# Patient Record
Sex: Female | Born: 1945 | Race: White | Hispanic: No | Marital: Married | Smoking: Never smoker
Health system: Southern US, Community
[De-identification: ages and names within clinical notes are randomized; demographics above are authoritative.]

## PROBLEM LIST (undated history)

## (undated) DIAGNOSIS — T7840XA Allergy, unspecified, initial encounter: Secondary | ICD-10-CM

## (undated) DIAGNOSIS — I959 Hypotension, unspecified: Secondary | ICD-10-CM

## (undated) DIAGNOSIS — R112 Nausea with vomiting, unspecified: Secondary | ICD-10-CM

## (undated) DIAGNOSIS — Z9889 Other specified postprocedural states: Secondary | ICD-10-CM

## (undated) DIAGNOSIS — F32A Depression, unspecified: Secondary | ICD-10-CM

## (undated) DIAGNOSIS — F329 Major depressive disorder, single episode, unspecified: Secondary | ICD-10-CM

## (undated) HISTORY — DX: Depression, unspecified: F32.A

## (undated) HISTORY — PX: SHOULDER SURGERY: SHX246

## (undated) HISTORY — DX: Allergy, unspecified, initial encounter: T78.40XA

## (undated) HISTORY — DX: Major depressive disorder, single episode, unspecified: F32.9

## (undated) HISTORY — DX: Hypotension, unspecified: I95.9

## (undated) HISTORY — DX: Nausea with vomiting, unspecified: R11.2

## (undated) HISTORY — PX: PARTIAL HYSTERECTOMY: SHX80

## (undated) HISTORY — DX: Hereditary hemochromatosis: E83.110

## (undated) HISTORY — PX: TONSILLECTOMY AND ADENOIDECTOMY: SUR1326

## (undated) HISTORY — DX: Nausea with vomiting, unspecified: Z98.890

---

## 1972-07-02 HISTORY — PX: TUBAL LIGATION: SHX77

## 2011-07-03 HISTORY — PX: COLONOSCOPY: SHX174

## 2014-07-02 HISTORY — PX: ATRIAL FIBRILLATION ABLATION: EP1191

## 2014-07-04 DIAGNOSIS — J019 Acute sinusitis, unspecified: Secondary | ICD-10-CM | POA: Diagnosis not present

## 2014-09-05 DIAGNOSIS — J019 Acute sinusitis, unspecified: Secondary | ICD-10-CM | POA: Diagnosis not present

## 2014-10-29 DIAGNOSIS — J0100 Acute maxillary sinusitis, unspecified: Secondary | ICD-10-CM | POA: Diagnosis not present

## 2014-10-29 DIAGNOSIS — J309 Allergic rhinitis, unspecified: Secondary | ICD-10-CM | POA: Diagnosis not present

## 2014-12-22 DIAGNOSIS — I495 Sick sinus syndrome: Secondary | ICD-10-CM | POA: Insufficient documentation

## 2014-12-23 DIAGNOSIS — I951 Orthostatic hypotension: Secondary | ICD-10-CM | POA: Insufficient documentation

## 2014-12-23 DIAGNOSIS — I480 Paroxysmal atrial fibrillation: Secondary | ICD-10-CM | POA: Diagnosis not present

## 2014-12-23 DIAGNOSIS — I495 Sick sinus syndrome: Secondary | ICD-10-CM | POA: Diagnosis not present

## 2015-01-05 DIAGNOSIS — R079 Chest pain, unspecified: Secondary | ICD-10-CM | POA: Diagnosis not present

## 2015-01-05 DIAGNOSIS — Z1389 Encounter for screening for other disorder: Secondary | ICD-10-CM | POA: Diagnosis not present

## 2015-01-05 DIAGNOSIS — N644 Mastodynia: Secondary | ICD-10-CM | POA: Diagnosis not present

## 2015-01-10 DIAGNOSIS — N644 Mastodynia: Secondary | ICD-10-CM | POA: Diagnosis not present

## 2015-01-10 DIAGNOSIS — R922 Inconclusive mammogram: Secondary | ICD-10-CM | POA: Diagnosis not present

## 2015-01-11 DIAGNOSIS — B86 Scabies: Secondary | ICD-10-CM | POA: Diagnosis not present

## 2015-01-11 DIAGNOSIS — B9689 Other specified bacterial agents as the cause of diseases classified elsewhere: Secondary | ICD-10-CM | POA: Diagnosis not present

## 2015-01-11 DIAGNOSIS — J019 Acute sinusitis, unspecified: Secondary | ICD-10-CM | POA: Diagnosis not present

## 2015-04-15 DIAGNOSIS — Z23 Encounter for immunization: Secondary | ICD-10-CM | POA: Diagnosis not present

## 2015-04-15 DIAGNOSIS — R079 Chest pain, unspecified: Secondary | ICD-10-CM | POA: Diagnosis not present

## 2015-04-15 DIAGNOSIS — Z79899 Other long term (current) drug therapy: Secondary | ICD-10-CM | POA: Diagnosis not present

## 2015-04-15 DIAGNOSIS — F419 Anxiety disorder, unspecified: Secondary | ICD-10-CM | POA: Diagnosis not present

## 2015-04-15 DIAGNOSIS — R300 Dysuria: Secondary | ICD-10-CM | POA: Diagnosis not present

## 2015-04-15 DIAGNOSIS — Z0000 Encounter for general adult medical examination without abnormal findings: Secondary | ICD-10-CM | POA: Diagnosis not present

## 2015-04-15 DIAGNOSIS — J3089 Other allergic rhinitis: Secondary | ICD-10-CM | POA: Diagnosis not present

## 2015-04-15 DIAGNOSIS — J329 Chronic sinusitis, unspecified: Secondary | ICD-10-CM | POA: Diagnosis not present

## 2015-06-04 DIAGNOSIS — J019 Acute sinusitis, unspecified: Secondary | ICD-10-CM | POA: Diagnosis not present

## 2015-06-17 DIAGNOSIS — J209 Acute bronchitis, unspecified: Secondary | ICD-10-CM | POA: Diagnosis not present

## 2015-06-17 DIAGNOSIS — J069 Acute upper respiratory infection, unspecified: Secondary | ICD-10-CM | POA: Diagnosis not present

## 2015-06-28 DIAGNOSIS — Z0000 Encounter for general adult medical examination without abnormal findings: Secondary | ICD-10-CM | POA: Diagnosis not present

## 2015-06-28 DIAGNOSIS — R9431 Abnormal electrocardiogram [ECG] [EKG]: Secondary | ICD-10-CM | POA: Diagnosis not present

## 2015-06-28 DIAGNOSIS — R001 Bradycardia, unspecified: Secondary | ICD-10-CM | POA: Diagnosis not present

## 2015-06-28 DIAGNOSIS — Z6833 Body mass index (BMI) 33.0-33.9, adult: Secondary | ICD-10-CM | POA: Diagnosis not present

## 2015-06-28 DIAGNOSIS — J309 Allergic rhinitis, unspecified: Secondary | ICD-10-CM | POA: Diagnosis not present

## 2015-06-28 DIAGNOSIS — E669 Obesity, unspecified: Secondary | ICD-10-CM | POA: Diagnosis not present

## 2015-06-28 DIAGNOSIS — K219 Gastro-esophageal reflux disease without esophagitis: Secondary | ICD-10-CM | POA: Diagnosis not present

## 2015-06-28 DIAGNOSIS — F419 Anxiety disorder, unspecified: Secondary | ICD-10-CM | POA: Diagnosis not present

## 2015-07-03 HISTORY — PX: CHOLECYSTECTOMY: SHX55

## 2015-07-13 DIAGNOSIS — I480 Paroxysmal atrial fibrillation: Secondary | ICD-10-CM | POA: Diagnosis not present

## 2015-07-13 DIAGNOSIS — R001 Bradycardia, unspecified: Secondary | ICD-10-CM | POA: Diagnosis not present

## 2015-07-13 DIAGNOSIS — I951 Orthostatic hypotension: Secondary | ICD-10-CM | POA: Diagnosis not present

## 2015-08-29 DIAGNOSIS — H9191 Unspecified hearing loss, right ear: Secondary | ICD-10-CM | POA: Diagnosis not present

## 2015-09-06 DIAGNOSIS — K8000 Calculus of gallbladder with acute cholecystitis without obstruction: Secondary | ICD-10-CM | POA: Diagnosis not present

## 2015-09-06 DIAGNOSIS — Z79899 Other long term (current) drug therapy: Secondary | ICD-10-CM | POA: Diagnosis not present

## 2015-09-06 DIAGNOSIS — Z880 Allergy status to penicillin: Secondary | ICD-10-CM | POA: Diagnosis not present

## 2015-09-06 DIAGNOSIS — M1990 Unspecified osteoarthritis, unspecified site: Secondary | ICD-10-CM | POA: Diagnosis not present

## 2015-09-06 DIAGNOSIS — R079 Chest pain, unspecified: Secondary | ICD-10-CM | POA: Diagnosis not present

## 2015-09-06 DIAGNOSIS — Z91040 Latex allergy status: Secondary | ICD-10-CM | POA: Diagnosis not present

## 2015-09-06 DIAGNOSIS — I509 Heart failure, unspecified: Secondary | ICD-10-CM | POA: Diagnosis not present

## 2015-09-06 DIAGNOSIS — Z7982 Long term (current) use of aspirin: Secondary | ICD-10-CM | POA: Diagnosis not present

## 2015-09-06 DIAGNOSIS — Z950 Presence of cardiac pacemaker: Secondary | ICD-10-CM | POA: Diagnosis not present

## 2015-09-06 DIAGNOSIS — K8020 Calculus of gallbladder without cholecystitis without obstruction: Secondary | ICD-10-CM | POA: Diagnosis not present

## 2015-09-06 DIAGNOSIS — K8012 Calculus of gallbladder with acute and chronic cholecystitis without obstruction: Secondary | ICD-10-CM | POA: Diagnosis not present

## 2015-09-06 DIAGNOSIS — K819 Cholecystitis, unspecified: Secondary | ICD-10-CM | POA: Diagnosis not present

## 2015-09-06 DIAGNOSIS — Z885 Allergy status to narcotic agent status: Secondary | ICD-10-CM | POA: Diagnosis not present

## 2015-09-06 DIAGNOSIS — R109 Unspecified abdominal pain: Secondary | ICD-10-CM | POA: Diagnosis not present

## 2015-09-15 DIAGNOSIS — R509 Fever, unspecified: Secondary | ICD-10-CM | POA: Diagnosis not present

## 2015-09-15 DIAGNOSIS — J189 Pneumonia, unspecified organism: Secondary | ICD-10-CM | POA: Diagnosis not present

## 2015-09-15 DIAGNOSIS — J0100 Acute maxillary sinusitis, unspecified: Secondary | ICD-10-CM | POA: Diagnosis not present

## 2015-09-26 DIAGNOSIS — Z09 Encounter for follow-up examination after completed treatment for conditions other than malignant neoplasm: Secondary | ICD-10-CM | POA: Insufficient documentation

## 2015-11-25 DIAGNOSIS — J189 Pneumonia, unspecified organism: Secondary | ICD-10-CM | POA: Diagnosis not present

## 2015-11-25 DIAGNOSIS — J0100 Acute maxillary sinusitis, unspecified: Secondary | ICD-10-CM | POA: Diagnosis not present

## 2015-11-25 DIAGNOSIS — R509 Fever, unspecified: Secondary | ICD-10-CM | POA: Diagnosis not present

## 2015-11-26 DIAGNOSIS — R0902 Hypoxemia: Secondary | ICD-10-CM | POA: Diagnosis not present

## 2015-11-27 DIAGNOSIS — Z7982 Long term (current) use of aspirin: Secondary | ICD-10-CM | POA: Diagnosis not present

## 2015-11-27 DIAGNOSIS — R0602 Shortness of breath: Secondary | ICD-10-CM | POA: Diagnosis not present

## 2015-11-27 DIAGNOSIS — R938 Abnormal findings on diagnostic imaging of other specified body structures: Secondary | ICD-10-CM | POA: Diagnosis not present

## 2015-11-27 DIAGNOSIS — Z79899 Other long term (current) drug therapy: Secondary | ICD-10-CM | POA: Diagnosis not present

## 2015-11-27 DIAGNOSIS — I509 Heart failure, unspecified: Secondary | ICD-10-CM | POA: Diagnosis not present

## 2015-11-27 DIAGNOSIS — R0902 Hypoxemia: Secondary | ICD-10-CM | POA: Diagnosis not present

## 2015-11-27 DIAGNOSIS — R05 Cough: Secondary | ICD-10-CM | POA: Diagnosis not present

## 2015-11-27 DIAGNOSIS — Z8679 Personal history of other diseases of the circulatory system: Secondary | ICD-10-CM | POA: Diagnosis not present

## 2015-11-27 DIAGNOSIS — J209 Acute bronchitis, unspecified: Secondary | ICD-10-CM | POA: Diagnosis not present

## 2015-12-05 DIAGNOSIS — J209 Acute bronchitis, unspecified: Secondary | ICD-10-CM | POA: Diagnosis not present

## 2015-12-05 DIAGNOSIS — R1013 Epigastric pain: Secondary | ICD-10-CM | POA: Diagnosis not present

## 2015-12-05 DIAGNOSIS — R42 Dizziness and giddiness: Secondary | ICD-10-CM | POA: Diagnosis not present

## 2015-12-05 DIAGNOSIS — K219 Gastro-esophageal reflux disease without esophagitis: Secondary | ICD-10-CM | POA: Diagnosis not present

## 2015-12-05 DIAGNOSIS — I951 Orthostatic hypotension: Secondary | ICD-10-CM | POA: Diagnosis not present

## 2015-12-13 DIAGNOSIS — R55 Syncope and collapse: Secondary | ICD-10-CM | POA: Diagnosis not present

## 2015-12-13 DIAGNOSIS — I951 Orthostatic hypotension: Secondary | ICD-10-CM | POA: Diagnosis not present

## 2015-12-13 DIAGNOSIS — R938 Abnormal findings on diagnostic imaging of other specified body structures: Secondary | ICD-10-CM | POA: Diagnosis not present

## 2015-12-13 DIAGNOSIS — R42 Dizziness and giddiness: Secondary | ICD-10-CM | POA: Diagnosis not present

## 2015-12-13 DIAGNOSIS — I499 Cardiac arrhythmia, unspecified: Secondary | ICD-10-CM | POA: Diagnosis not present

## 2016-01-16 DIAGNOSIS — J00 Acute nasopharyngitis [common cold]: Secondary | ICD-10-CM | POA: Diagnosis not present

## 2016-01-19 DIAGNOSIS — I495 Sick sinus syndrome: Secondary | ICD-10-CM | POA: Diagnosis not present

## 2016-01-19 DIAGNOSIS — Z9889 Other specified postprocedural states: Secondary | ICD-10-CM | POA: Diagnosis not present

## 2016-01-19 DIAGNOSIS — Z8679 Personal history of other diseases of the circulatory system: Secondary | ICD-10-CM | POA: Diagnosis not present

## 2016-01-19 DIAGNOSIS — R001 Bradycardia, unspecified: Secondary | ICD-10-CM | POA: Diagnosis not present

## 2016-01-19 DIAGNOSIS — I951 Orthostatic hypotension: Secondary | ICD-10-CM | POA: Diagnosis not present

## 2016-01-23 DIAGNOSIS — R001 Bradycardia, unspecified: Secondary | ICD-10-CM | POA: Diagnosis not present

## 2016-01-29 DIAGNOSIS — Z8679 Personal history of other diseases of the circulatory system: Secondary | ICD-10-CM | POA: Diagnosis not present

## 2016-01-29 DIAGNOSIS — Z9889 Other specified postprocedural states: Secondary | ICD-10-CM | POA: Diagnosis not present

## 2016-02-12 DIAGNOSIS — J0100 Acute maxillary sinusitis, unspecified: Secondary | ICD-10-CM | POA: Diagnosis not present

## 2016-02-12 DIAGNOSIS — J209 Acute bronchitis, unspecified: Secondary | ICD-10-CM | POA: Diagnosis not present

## 2016-03-22 DIAGNOSIS — R001 Bradycardia, unspecified: Secondary | ICD-10-CM | POA: Diagnosis not present

## 2016-03-22 DIAGNOSIS — Z9889 Other specified postprocedural states: Secondary | ICD-10-CM | POA: Diagnosis not present

## 2016-03-22 DIAGNOSIS — I495 Sick sinus syndrome: Secondary | ICD-10-CM | POA: Diagnosis not present

## 2016-03-22 DIAGNOSIS — I4891 Unspecified atrial fibrillation: Secondary | ICD-10-CM | POA: Diagnosis not present

## 2016-03-22 DIAGNOSIS — Z8679 Personal history of other diseases of the circulatory system: Secondary | ICD-10-CM | POA: Diagnosis not present

## 2016-03-22 DIAGNOSIS — I951 Orthostatic hypotension: Secondary | ICD-10-CM | POA: Diagnosis not present

## 2016-03-22 DIAGNOSIS — I480 Paroxysmal atrial fibrillation: Secondary | ICD-10-CM | POA: Diagnosis not present

## 2016-04-14 DIAGNOSIS — R0602 Shortness of breath: Secondary | ICD-10-CM | POA: Diagnosis not present

## 2016-04-14 DIAGNOSIS — J189 Pneumonia, unspecified organism: Secondary | ICD-10-CM | POA: Diagnosis not present

## 2016-05-03 DIAGNOSIS — Z1389 Encounter for screening for other disorder: Secondary | ICD-10-CM | POA: Diagnosis not present

## 2016-05-03 DIAGNOSIS — J3089 Other allergic rhinitis: Secondary | ICD-10-CM | POA: Diagnosis not present

## 2016-05-03 DIAGNOSIS — Z23 Encounter for immunization: Secondary | ICD-10-CM | POA: Diagnosis not present

## 2016-05-03 DIAGNOSIS — F419 Anxiety disorder, unspecified: Secondary | ICD-10-CM | POA: Diagnosis not present

## 2016-05-03 DIAGNOSIS — Z5181 Encounter for therapeutic drug level monitoring: Secondary | ICD-10-CM | POA: Diagnosis not present

## 2016-05-03 DIAGNOSIS — R918 Other nonspecific abnormal finding of lung field: Secondary | ICD-10-CM | POA: Diagnosis not present

## 2016-05-03 DIAGNOSIS — Z9181 History of falling: Secondary | ICD-10-CM | POA: Diagnosis not present

## 2016-05-10 DIAGNOSIS — I517 Cardiomegaly: Secondary | ICD-10-CM | POA: Diagnosis not present

## 2016-05-10 DIAGNOSIS — R911 Solitary pulmonary nodule: Secondary | ICD-10-CM | POA: Diagnosis not present

## 2016-05-10 DIAGNOSIS — I700 Atherosclerosis of aorta: Secondary | ICD-10-CM | POA: Diagnosis not present

## 2016-05-10 DIAGNOSIS — R918 Other nonspecific abnormal finding of lung field: Secondary | ICD-10-CM | POA: Diagnosis not present

## 2016-05-16 DIAGNOSIS — J329 Chronic sinusitis, unspecified: Secondary | ICD-10-CM | POA: Diagnosis not present

## 2016-06-05 DIAGNOSIS — Z1231 Encounter for screening mammogram for malignant neoplasm of breast: Secondary | ICD-10-CM | POA: Diagnosis not present

## 2016-06-26 DIAGNOSIS — S81801A Unspecified open wound, right lower leg, initial encounter: Secondary | ICD-10-CM | POA: Diagnosis not present

## 2016-08-01 DIAGNOSIS — J111 Influenza due to unidentified influenza virus with other respiratory manifestations: Secondary | ICD-10-CM | POA: Diagnosis not present

## 2016-09-14 DIAGNOSIS — I480 Paroxysmal atrial fibrillation: Secondary | ICD-10-CM | POA: Diagnosis not present

## 2016-09-14 DIAGNOSIS — Z9889 Other specified postprocedural states: Secondary | ICD-10-CM | POA: Diagnosis not present

## 2016-09-14 DIAGNOSIS — R001 Bradycardia, unspecified: Secondary | ICD-10-CM | POA: Diagnosis not present

## 2016-09-14 DIAGNOSIS — I951 Orthostatic hypotension: Secondary | ICD-10-CM | POA: Diagnosis not present

## 2016-09-14 DIAGNOSIS — Z8679 Personal history of other diseases of the circulatory system: Secondary | ICD-10-CM | POA: Diagnosis not present

## 2016-09-14 DIAGNOSIS — I495 Sick sinus syndrome: Secondary | ICD-10-CM | POA: Diagnosis not present

## 2016-09-20 DIAGNOSIS — Z6833 Body mass index (BMI) 33.0-33.9, adult: Secondary | ICD-10-CM | POA: Diagnosis not present

## 2016-09-20 DIAGNOSIS — R1310 Dysphagia, unspecified: Secondary | ICD-10-CM | POA: Diagnosis not present

## 2016-09-20 DIAGNOSIS — S41119A Laceration without foreign body of unspecified upper arm, initial encounter: Secondary | ICD-10-CM | POA: Diagnosis not present

## 2016-09-20 DIAGNOSIS — R062 Wheezing: Secondary | ICD-10-CM | POA: Diagnosis not present

## 2016-09-20 DIAGNOSIS — Z0000 Encounter for general adult medical examination without abnormal findings: Secondary | ICD-10-CM | POA: Diagnosis not present

## 2016-10-15 DIAGNOSIS — R05 Cough: Secondary | ICD-10-CM | POA: Diagnosis not present

## 2016-10-15 DIAGNOSIS — J3489 Other specified disorders of nose and nasal sinuses: Secondary | ICD-10-CM | POA: Diagnosis not present

## 2016-10-15 DIAGNOSIS — K219 Gastro-esophageal reflux disease without esophagitis: Secondary | ICD-10-CM | POA: Diagnosis not present

## 2016-10-15 DIAGNOSIS — R1310 Dysphagia, unspecified: Secondary | ICD-10-CM | POA: Diagnosis not present

## 2016-10-15 DIAGNOSIS — J342 Deviated nasal septum: Secondary | ICD-10-CM | POA: Diagnosis not present

## 2016-10-15 DIAGNOSIS — R0989 Other specified symptoms and signs involving the circulatory and respiratory systems: Secondary | ICD-10-CM | POA: Diagnosis not present

## 2016-10-18 DIAGNOSIS — R1310 Dysphagia, unspecified: Secondary | ICD-10-CM | POA: Diagnosis not present

## 2016-11-11 DIAGNOSIS — R0789 Other chest pain: Secondary | ICD-10-CM | POA: Diagnosis not present

## 2016-11-11 DIAGNOSIS — R1013 Epigastric pain: Secondary | ICD-10-CM | POA: Diagnosis not present

## 2016-11-11 DIAGNOSIS — R1110 Vomiting, unspecified: Secondary | ICD-10-CM | POA: Diagnosis not present

## 2016-11-11 DIAGNOSIS — O8629 Other urinary tract infection following delivery: Secondary | ICD-10-CM | POA: Diagnosis not present

## 2016-11-11 DIAGNOSIS — R42 Dizziness and giddiness: Secondary | ICD-10-CM | POA: Diagnosis not present

## 2016-11-11 DIAGNOSIS — R300 Dysuria: Secondary | ICD-10-CM | POA: Diagnosis not present

## 2016-11-11 DIAGNOSIS — N3001 Acute cystitis with hematuria: Secondary | ICD-10-CM | POA: Diagnosis not present

## 2016-11-15 DIAGNOSIS — K219 Gastro-esophageal reflux disease without esophagitis: Secondary | ICD-10-CM | POA: Diagnosis not present

## 2016-11-15 DIAGNOSIS — R1310 Dysphagia, unspecified: Secondary | ICD-10-CM | POA: Diagnosis not present

## 2016-11-27 DIAGNOSIS — H5203 Hypermetropia, bilateral: Secondary | ICD-10-CM | POA: Diagnosis not present

## 2016-11-27 DIAGNOSIS — H52209 Unspecified astigmatism, unspecified eye: Secondary | ICD-10-CM | POA: Diagnosis not present

## 2016-11-27 DIAGNOSIS — H524 Presbyopia: Secondary | ICD-10-CM | POA: Diagnosis not present

## 2016-12-14 DIAGNOSIS — J0100 Acute maxillary sinusitis, unspecified: Secondary | ICD-10-CM | POA: Diagnosis not present

## 2016-12-20 DIAGNOSIS — J343 Hypertrophy of nasal turbinates: Secondary | ICD-10-CM | POA: Diagnosis not present

## 2016-12-20 DIAGNOSIS — K219 Gastro-esophageal reflux disease without esophagitis: Secondary | ICD-10-CM | POA: Diagnosis not present

## 2016-12-20 DIAGNOSIS — J3489 Other specified disorders of nose and nasal sinuses: Secondary | ICD-10-CM | POA: Diagnosis not present

## 2016-12-20 DIAGNOSIS — R0981 Nasal congestion: Secondary | ICD-10-CM | POA: Diagnosis not present

## 2016-12-20 DIAGNOSIS — R194 Change in bowel habit: Secondary | ICD-10-CM | POA: Diagnosis not present

## 2016-12-20 DIAGNOSIS — R0989 Other specified symptoms and signs involving the circulatory and respiratory systems: Secondary | ICD-10-CM | POA: Diagnosis not present

## 2016-12-20 DIAGNOSIS — R1310 Dysphagia, unspecified: Secondary | ICD-10-CM | POA: Diagnosis not present

## 2017-01-29 DIAGNOSIS — Z6833 Body mass index (BMI) 33.0-33.9, adult: Secondary | ICD-10-CM | POA: Diagnosis not present

## 2017-01-29 DIAGNOSIS — J329 Chronic sinusitis, unspecified: Secondary | ICD-10-CM | POA: Diagnosis not present

## 2017-01-29 DIAGNOSIS — R1013 Epigastric pain: Secondary | ICD-10-CM | POA: Diagnosis not present

## 2017-03-08 DIAGNOSIS — S8992XA Unspecified injury of left lower leg, initial encounter: Secondary | ICD-10-CM | POA: Diagnosis not present

## 2017-03-08 DIAGNOSIS — S82832A Other fracture of upper and lower end of left fibula, initial encounter for closed fracture: Secondary | ICD-10-CM | POA: Diagnosis not present

## 2017-03-08 DIAGNOSIS — M25572 Pain in left ankle and joints of left foot: Secondary | ICD-10-CM | POA: Diagnosis not present

## 2017-03-08 DIAGNOSIS — S99922A Unspecified injury of left foot, initial encounter: Secondary | ICD-10-CM | POA: Diagnosis not present

## 2017-03-08 DIAGNOSIS — T148XXA Other injury of unspecified body region, initial encounter: Secondary | ICD-10-CM | POA: Diagnosis not present

## 2017-03-08 DIAGNOSIS — S99912A Unspecified injury of left ankle, initial encounter: Secondary | ICD-10-CM | POA: Diagnosis not present

## 2017-03-08 DIAGNOSIS — M7989 Other specified soft tissue disorders: Secondary | ICD-10-CM | POA: Diagnosis not present

## 2017-03-08 DIAGNOSIS — S89302A Unspecified physeal fracture of lower end of left fibula, initial encounter for closed fracture: Secondary | ICD-10-CM | POA: Diagnosis not present

## 2017-03-12 DIAGNOSIS — S8265XA Nondisplaced fracture of lateral malleolus of left fibula, initial encounter for closed fracture: Secondary | ICD-10-CM | POA: Diagnosis not present

## 2017-03-20 DIAGNOSIS — S8265XA Nondisplaced fracture of lateral malleolus of left fibula, initial encounter for closed fracture: Secondary | ICD-10-CM | POA: Diagnosis not present

## 2017-04-03 DIAGNOSIS — Z780 Asymptomatic menopausal state: Secondary | ICD-10-CM | POA: Diagnosis not present

## 2017-04-03 DIAGNOSIS — M8589 Other specified disorders of bone density and structure, multiple sites: Secondary | ICD-10-CM | POA: Diagnosis not present

## 2017-04-03 DIAGNOSIS — M8580 Other specified disorders of bone density and structure, unspecified site: Secondary | ICD-10-CM | POA: Diagnosis not present

## 2017-04-17 DIAGNOSIS — S8265XD Nondisplaced fracture of lateral malleolus of left fibula, subsequent encounter for closed fracture with routine healing: Secondary | ICD-10-CM | POA: Diagnosis not present

## 2017-05-07 DIAGNOSIS — J209 Acute bronchitis, unspecified: Secondary | ICD-10-CM | POA: Diagnosis not present

## 2017-05-16 DIAGNOSIS — S8265XD Nondisplaced fracture of lateral malleolus of left fibula, subsequent encounter for closed fracture with routine healing: Secondary | ICD-10-CM | POA: Diagnosis not present

## 2017-05-29 DIAGNOSIS — Z9181 History of falling: Secondary | ICD-10-CM | POA: Diagnosis not present

## 2017-05-29 DIAGNOSIS — E785 Hyperlipidemia, unspecified: Secondary | ICD-10-CM | POA: Diagnosis not present

## 2017-05-29 DIAGNOSIS — Z1331 Encounter for screening for depression: Secondary | ICD-10-CM | POA: Diagnosis not present

## 2017-05-29 DIAGNOSIS — Z6833 Body mass index (BMI) 33.0-33.9, adult: Secondary | ICD-10-CM | POA: Diagnosis not present

## 2017-05-29 DIAGNOSIS — Z79899 Other long term (current) drug therapy: Secondary | ICD-10-CM | POA: Diagnosis not present

## 2017-05-29 DIAGNOSIS — J40 Bronchitis, not specified as acute or chronic: Secondary | ICD-10-CM | POA: Diagnosis not present

## 2017-06-14 DIAGNOSIS — R1013 Epigastric pain: Secondary | ICD-10-CM | POA: Diagnosis not present

## 2017-06-26 DIAGNOSIS — Z1231 Encounter for screening mammogram for malignant neoplasm of breast: Secondary | ICD-10-CM | POA: Diagnosis not present

## 2017-06-27 DIAGNOSIS — R1013 Epigastric pain: Secondary | ICD-10-CM | POA: Diagnosis not present

## 2017-06-27 DIAGNOSIS — K760 Fatty (change of) liver, not elsewhere classified: Secondary | ICD-10-CM | POA: Diagnosis not present

## 2017-07-17 DIAGNOSIS — L723 Sebaceous cyst: Secondary | ICD-10-CM | POA: Diagnosis not present

## 2017-07-17 DIAGNOSIS — Z6833 Body mass index (BMI) 33.0-33.9, adult: Secondary | ICD-10-CM | POA: Diagnosis not present

## 2017-07-17 DIAGNOSIS — J329 Chronic sinusitis, unspecified: Secondary | ICD-10-CM | POA: Diagnosis not present

## 2017-07-17 DIAGNOSIS — L089 Local infection of the skin and subcutaneous tissue, unspecified: Secondary | ICD-10-CM | POA: Diagnosis not present

## 2017-07-23 DIAGNOSIS — L0390 Cellulitis, unspecified: Secondary | ICD-10-CM | POA: Diagnosis not present

## 2017-07-23 DIAGNOSIS — L281 Prurigo nodularis: Secondary | ICD-10-CM | POA: Diagnosis not present

## 2017-07-23 DIAGNOSIS — L02811 Cutaneous abscess of head [any part, except face]: Secondary | ICD-10-CM | POA: Diagnosis not present

## 2017-08-12 HISTORY — PX: UPPER GASTROINTESTINAL ENDOSCOPY: SHX188

## 2017-08-14 DIAGNOSIS — K259 Gastric ulcer, unspecified as acute or chronic, without hemorrhage or perforation: Secondary | ICD-10-CM | POA: Diagnosis not present

## 2017-08-14 DIAGNOSIS — K253 Acute gastric ulcer without hemorrhage or perforation: Secondary | ICD-10-CM | POA: Diagnosis not present

## 2017-08-14 DIAGNOSIS — K2970 Gastritis, unspecified, without bleeding: Secondary | ICD-10-CM | POA: Diagnosis not present

## 2017-08-14 DIAGNOSIS — Z79899 Other long term (current) drug therapy: Secondary | ICD-10-CM | POA: Diagnosis not present

## 2017-08-14 DIAGNOSIS — R1310 Dysphagia, unspecified: Secondary | ICD-10-CM | POA: Diagnosis not present

## 2017-08-14 DIAGNOSIS — K2900 Acute gastritis without bleeding: Secondary | ICD-10-CM | POA: Diagnosis not present

## 2017-08-14 DIAGNOSIS — K2950 Unspecified chronic gastritis without bleeding: Secondary | ICD-10-CM | POA: Diagnosis not present

## 2017-08-14 DIAGNOSIS — R1013 Epigastric pain: Secondary | ICD-10-CM | POA: Diagnosis not present

## 2017-08-14 DIAGNOSIS — F418 Other specified anxiety disorders: Secondary | ICD-10-CM | POA: Diagnosis not present

## 2017-08-14 HISTORY — PX: UPPER GASTROINTESTINAL ENDOSCOPY: SHX188

## 2017-09-01 DIAGNOSIS — J0100 Acute maxillary sinusitis, unspecified: Secondary | ICD-10-CM | POA: Diagnosis not present

## 2017-09-02 DIAGNOSIS — I10 Essential (primary) hypertension: Secondary | ICD-10-CM | POA: Diagnosis not present

## 2017-09-02 DIAGNOSIS — S82839A Other fracture of upper and lower end of unspecified fibula, initial encounter for closed fracture: Secondary | ICD-10-CM | POA: Diagnosis not present

## 2017-09-02 DIAGNOSIS — Z7982 Long term (current) use of aspirin: Secondary | ICD-10-CM | POA: Diagnosis not present

## 2017-09-02 DIAGNOSIS — R600 Localized edema: Secondary | ICD-10-CM | POA: Diagnosis not present

## 2017-09-02 DIAGNOSIS — Z79899 Other long term (current) drug therapy: Secondary | ICD-10-CM | POA: Diagnosis not present

## 2017-09-02 DIAGNOSIS — M1990 Unspecified osteoarthritis, unspecified site: Secondary | ICD-10-CM | POA: Diagnosis not present

## 2017-09-02 DIAGNOSIS — E860 Dehydration: Secondary | ICD-10-CM | POA: Diagnosis not present

## 2017-09-02 DIAGNOSIS — R8271 Bacteriuria: Secondary | ICD-10-CM | POA: Diagnosis not present

## 2017-09-02 DIAGNOSIS — R55 Syncope and collapse: Secondary | ICD-10-CM | POA: Diagnosis not present

## 2017-09-02 DIAGNOSIS — N3000 Acute cystitis without hematuria: Secondary | ICD-10-CM | POA: Diagnosis not present

## 2017-09-02 DIAGNOSIS — S82832A Other fracture of upper and lower end of left fibula, initial encounter for closed fracture: Secondary | ICD-10-CM | POA: Diagnosis not present

## 2017-09-02 DIAGNOSIS — R110 Nausea: Secondary | ICD-10-CM | POA: Diagnosis not present

## 2017-09-03 ENCOUNTER — Encounter: Payer: Self-pay | Admitting: Gastroenterology

## 2017-09-03 DIAGNOSIS — S82839A Other fracture of upper and lower end of unspecified fibula, initial encounter for closed fracture: Secondary | ICD-10-CM | POA: Diagnosis not present

## 2017-09-03 DIAGNOSIS — R55 Syncope and collapse: Secondary | ICD-10-CM | POA: Diagnosis not present

## 2017-09-09 DIAGNOSIS — M25672 Stiffness of left ankle, not elsewhere classified: Secondary | ICD-10-CM | POA: Diagnosis not present

## 2017-09-09 DIAGNOSIS — M6281 Muscle weakness (generalized): Secondary | ICD-10-CM | POA: Diagnosis not present

## 2017-09-09 DIAGNOSIS — R55 Syncope and collapse: Secondary | ICD-10-CM | POA: Diagnosis not present

## 2017-09-09 DIAGNOSIS — M25572 Pain in left ankle and joints of left foot: Secondary | ICD-10-CM | POA: Diagnosis not present

## 2017-09-09 DIAGNOSIS — Z6833 Body mass index (BMI) 33.0-33.9, adult: Secondary | ICD-10-CM | POA: Diagnosis not present

## 2017-09-09 DIAGNOSIS — R2689 Other abnormalities of gait and mobility: Secondary | ICD-10-CM | POA: Diagnosis not present

## 2017-09-09 DIAGNOSIS — M79662 Pain in left lower leg: Secondary | ICD-10-CM | POA: Diagnosis not present

## 2017-09-09 DIAGNOSIS — S82839D Other fracture of upper and lower end of unspecified fibula, subsequent encounter for closed fracture with routine healing: Secondary | ICD-10-CM | POA: Diagnosis not present

## 2017-09-10 DIAGNOSIS — S82402A Unspecified fracture of shaft of left fibula, initial encounter for closed fracture: Secondary | ICD-10-CM | POA: Diagnosis not present

## 2017-09-13 DIAGNOSIS — M25572 Pain in left ankle and joints of left foot: Secondary | ICD-10-CM | POA: Diagnosis not present

## 2017-09-13 DIAGNOSIS — R2689 Other abnormalities of gait and mobility: Secondary | ICD-10-CM | POA: Diagnosis not present

## 2017-09-13 DIAGNOSIS — M25672 Stiffness of left ankle, not elsewhere classified: Secondary | ICD-10-CM | POA: Diagnosis not present

## 2017-09-13 DIAGNOSIS — S82839D Other fracture of upper and lower end of unspecified fibula, subsequent encounter for closed fracture with routine healing: Secondary | ICD-10-CM | POA: Diagnosis not present

## 2017-09-13 DIAGNOSIS — M79662 Pain in left lower leg: Secondary | ICD-10-CM | POA: Diagnosis not present

## 2017-09-13 DIAGNOSIS — M6281 Muscle weakness (generalized): Secondary | ICD-10-CM | POA: Diagnosis not present

## 2017-09-18 DIAGNOSIS — M25572 Pain in left ankle and joints of left foot: Secondary | ICD-10-CM | POA: Diagnosis not present

## 2017-09-18 DIAGNOSIS — M79662 Pain in left lower leg: Secondary | ICD-10-CM | POA: Diagnosis not present

## 2017-09-18 DIAGNOSIS — R2689 Other abnormalities of gait and mobility: Secondary | ICD-10-CM | POA: Diagnosis not present

## 2017-09-18 DIAGNOSIS — M25672 Stiffness of left ankle, not elsewhere classified: Secondary | ICD-10-CM | POA: Diagnosis not present

## 2017-09-18 DIAGNOSIS — S82839D Other fracture of upper and lower end of unspecified fibula, subsequent encounter for closed fracture with routine healing: Secondary | ICD-10-CM | POA: Diagnosis not present

## 2017-09-18 DIAGNOSIS — M6281 Muscle weakness (generalized): Secondary | ICD-10-CM | POA: Diagnosis not present

## 2017-09-23 DIAGNOSIS — M25672 Stiffness of left ankle, not elsewhere classified: Secondary | ICD-10-CM | POA: Diagnosis not present

## 2017-09-23 DIAGNOSIS — M79662 Pain in left lower leg: Secondary | ICD-10-CM | POA: Diagnosis not present

## 2017-09-23 DIAGNOSIS — R2689 Other abnormalities of gait and mobility: Secondary | ICD-10-CM | POA: Diagnosis not present

## 2017-09-23 DIAGNOSIS — S82839D Other fracture of upper and lower end of unspecified fibula, subsequent encounter for closed fracture with routine healing: Secondary | ICD-10-CM | POA: Diagnosis not present

## 2017-09-23 DIAGNOSIS — M25572 Pain in left ankle and joints of left foot: Secondary | ICD-10-CM | POA: Diagnosis not present

## 2017-09-23 DIAGNOSIS — M6281 Muscle weakness (generalized): Secondary | ICD-10-CM | POA: Diagnosis not present

## 2017-09-25 DIAGNOSIS — R55 Syncope and collapse: Secondary | ICD-10-CM | POA: Diagnosis not present

## 2017-09-27 DIAGNOSIS — M6281 Muscle weakness (generalized): Secondary | ICD-10-CM | POA: Diagnosis not present

## 2017-09-27 DIAGNOSIS — M79662 Pain in left lower leg: Secondary | ICD-10-CM | POA: Diagnosis not present

## 2017-09-27 DIAGNOSIS — R2689 Other abnormalities of gait and mobility: Secondary | ICD-10-CM | POA: Diagnosis not present

## 2017-09-27 DIAGNOSIS — M25672 Stiffness of left ankle, not elsewhere classified: Secondary | ICD-10-CM | POA: Diagnosis not present

## 2017-09-27 DIAGNOSIS — M25572 Pain in left ankle and joints of left foot: Secondary | ICD-10-CM | POA: Diagnosis not present

## 2017-09-27 DIAGNOSIS — S82839D Other fracture of upper and lower end of unspecified fibula, subsequent encounter for closed fracture with routine healing: Secondary | ICD-10-CM | POA: Diagnosis not present

## 2017-10-02 ENCOUNTER — Ambulatory Visit (AMBULATORY_SURGERY_CENTER): Payer: Self-pay | Admitting: *Deleted

## 2017-10-02 ENCOUNTER — Other Ambulatory Visit: Payer: Self-pay

## 2017-10-02 VITALS — Ht 66.0 in | Wt 205.0 lb

## 2017-10-02 DIAGNOSIS — K253 Acute gastric ulcer without hemorrhage or perforation: Secondary | ICD-10-CM

## 2017-10-02 NOTE — Progress Notes (Signed)
Patient denies any allergies to eggs or soy. Patient does have nausea and vomiting with anesthesia/sedation. Patient denies any oxygen use at home. Patient denies taking any diet/weight loss medications or blood thinners. EMMI education assisgned to patient on EGD, this was explained and instructions given to patient. Patient is to see her heart dr Jeannie Doner.Akberry on Friday April 5,because she "passed out twice on September 02, 2017", pt then explained that she was taken to Hospital by her son, they did heart ultrasound which was fine, she then saw her PCP Dr.Holt and wore a heart monitor for 3 weeks, she has not been given those results yet. Instructed pt to call us back after she sees the heart dr on Friday and to let him know she is for the EGD and that we need cardiac clearance.  Explained to the patient and son that her EGD may need to be cancelled. Pt understands and states she will call us back after seeing the heart dr. On Friday.

## 2017-10-03 DIAGNOSIS — S82839D Other fracture of upper and lower end of unspecified fibula, subsequent encounter for closed fracture with routine healing: Secondary | ICD-10-CM | POA: Diagnosis not present

## 2017-10-03 DIAGNOSIS — S82402A Unspecified fracture of shaft of left fibula, initial encounter for closed fracture: Secondary | ICD-10-CM | POA: Diagnosis not present

## 2017-10-03 DIAGNOSIS — M79662 Pain in left lower leg: Secondary | ICD-10-CM | POA: Diagnosis not present

## 2017-10-03 DIAGNOSIS — M25572 Pain in left ankle and joints of left foot: Secondary | ICD-10-CM | POA: Diagnosis not present

## 2017-10-03 DIAGNOSIS — R2689 Other abnormalities of gait and mobility: Secondary | ICD-10-CM | POA: Diagnosis not present

## 2017-10-03 DIAGNOSIS — M25672 Stiffness of left ankle, not elsewhere classified: Secondary | ICD-10-CM | POA: Diagnosis not present

## 2017-10-03 DIAGNOSIS — M6281 Muscle weakness (generalized): Secondary | ICD-10-CM | POA: Diagnosis not present

## 2017-10-04 ENCOUNTER — Telehealth: Payer: Self-pay | Admitting: *Deleted

## 2017-10-04 ENCOUNTER — Encounter: Payer: Self-pay | Admitting: Gastroenterology

## 2017-10-04 DIAGNOSIS — I495 Sick sinus syndrome: Secondary | ICD-10-CM | POA: Diagnosis not present

## 2017-10-04 DIAGNOSIS — R001 Bradycardia, unspecified: Secondary | ICD-10-CM | POA: Diagnosis not present

## 2017-10-04 DIAGNOSIS — I951 Orthostatic hypotension: Secondary | ICD-10-CM | POA: Diagnosis not present

## 2017-10-04 DIAGNOSIS — Z8679 Personal history of other diseases of the circulatory system: Secondary | ICD-10-CM | POA: Diagnosis not present

## 2017-10-04 DIAGNOSIS — Z9889 Other specified postprocedural states: Secondary | ICD-10-CM | POA: Diagnosis not present

## 2017-10-04 DIAGNOSIS — I480 Paroxysmal atrial fibrillation: Secondary | ICD-10-CM | POA: Diagnosis not present

## 2017-10-04 NOTE — Telephone Encounter (Signed)
Called patient to check if she went to her cardiologist today, Dr. Linton HamAkberry.  No answer.  Left message for her to call back on Monday to let us know if she did go and if she was given clearance to have EGD on 4/17.  Bryson CoronaB. Benitez, CMA  PV

## 2017-10-04 NOTE — Telephone Encounter (Signed)
Patient states she spoke with cardiologist and was approved to have procedure.

## 2017-10-11 ENCOUNTER — Encounter: Payer: Self-pay | Admitting: *Deleted

## 2017-10-11 NOTE — Progress Notes (Signed)
Josh Monday,CRNA reviewed patient's chart and recent cardiologist OV. He states the patient does meet our criteria for LEC but he wants admitting to let the CRNA know patient's heart rate on arrival day of procedure.  Note left in admitting.

## 2017-10-16 ENCOUNTER — Encounter: Payer: Self-pay | Admitting: Gastroenterology

## 2017-10-16 ENCOUNTER — Ambulatory Visit (AMBULATORY_SURGERY_CENTER): Payer: Medicare HMO | Admitting: Gastroenterology

## 2017-10-16 ENCOUNTER — Other Ambulatory Visit: Payer: Self-pay

## 2017-10-16 VITALS — BP 176/78 | HR 69 | Temp 98.0°F | Resp 18 | Ht 66.0 in | Wt 205.0 lb

## 2017-10-16 DIAGNOSIS — I4891 Unspecified atrial fibrillation: Secondary | ICD-10-CM | POA: Diagnosis not present

## 2017-10-16 DIAGNOSIS — K319 Disease of stomach and duodenum, unspecified: Secondary | ICD-10-CM | POA: Diagnosis not present

## 2017-10-16 DIAGNOSIS — K253 Acute gastric ulcer without hemorrhage or perforation: Secondary | ICD-10-CM

## 2017-10-16 DIAGNOSIS — K259 Gastric ulcer, unspecified as acute or chronic, without hemorrhage or perforation: Secondary | ICD-10-CM | POA: Diagnosis not present

## 2017-10-16 DIAGNOSIS — F329 Major depressive disorder, single episode, unspecified: Secondary | ICD-10-CM | POA: Diagnosis not present

## 2017-10-16 DIAGNOSIS — K219 Gastro-esophageal reflux disease without esophagitis: Secondary | ICD-10-CM | POA: Diagnosis not present

## 2017-10-16 MED ORDER — SODIUM CHLORIDE 0.9 % IV SOLN
500.0000 mL | Freq: Once | INTRAVENOUS | Status: AC
Start: 2017-10-16 — End: ?

## 2017-10-16 NOTE — Op Note (Signed)
[facility] Patient Name: Jane Garcia Procedure Date: 10/16/2017 9:48 AM MRN: [id number] Endoscopist: Jackquline Denmark MD, MD Age: 72 Referring MD:  Date of Birth: 08-02-1945 Gender: Female Account #: [id number] Procedure:                Upper GI endoscopy Indications:              Personal history of peptic ulcer disease Medicines:                Monitored Anesthesia Care Procedure:                Pre-Anesthesia Assessment:                           - Prior to the procedure, a History and Physical                            was performed, and patient medications and                            allergies were reviewed. The patient is competent.                            The risks and benefits of the procedure and the                            sedation options and risks were discussed with the                            patient. All questions were answered and informed                            consent was obtained. Patient identification and                            proposed procedure were verified by the physician                            in the procedure room in the endoscopy suite.                            Mental Status Examination: alert and oriented.                            Prophylactic Antibiotics: The patient does not                            require prophylactic antibiotics. Prior                            Anticoagulants: The patient has taken no previous                            anticoagulant or antiplatelet agents. ASA Grade  Assessment: II - A patient with mild systemic                            disease. After reviewing the risks and benefits,                            the patient was deemed in satisfactory condition to                            undergo the procedure. The anesthesia plan was to                            use monitored anesthesia care (MAC). Immediately                            prior to administration of  medications, the patient                            was re-assessed for adequacy to receive sedatives.                            The heart rate, respiratory rate, oxygen                            saturations, blood pressure, adequacy of pulmonary                            ventilation, and response to care were monitored                            throughout the procedure. The physical status of                            the patient was re-assessed after the procedure.                           After obtaining informed consent, the endoscope was                            passed under direct vision. Throughout the                            procedure, the patient's blood pressure, pulse, and                            oxygen saturations were monitored continuously. The                            Endoscope was introduced through the mouth, and                            advanced to the second part of duodenum. The upper  GI endoscopy was accomplished without difficulty.                            The patient tolerated the procedure well. Scope In: Scope Out: Findings:                 Previously noted gastric ulcers had completely                            healed. There was antral scar with localized                            erythema. Multiple biopsies are obtained and sent                            for histopathologic examination.                           The exam was otherwise without abnormality. Complications:            No immediate complications. Estimated Blood Loss:     Estimated blood loss: none. Impression:               - Complete healing of gastric ulcers.                           - Mild localized gastritis with antral scar                            (biopsied). Recommendation:           - Await pathology results.                           - Continue present medications - omeprazole 20 mg                            by mouth once a day.                            - Resume previous diet. Jackquline Denmark MD, MD 10/16/2017 10:10:30 AM This report has been signed electronically.

## 2017-10-16 NOTE — Progress Notes (Signed)
Report to PACU, RN, vss, BBS= Clear.  

## 2017-10-16 NOTE — Patient Instructions (Signed)
Await results of biopsies taken today    YOU HAD AN ENDOSCOPIC PROCEDURE TODAY AT THE [facility]:   Refer to the procedure report that was given to you for any specific questions about what was found during the examination.  If the procedure report does not answer your questions, please call your gastroenterologist to clarify.  If you requested that your care partner not be given the details of your procedure findings, then the procedure report has been included in a sealed envelope for you to review at your convenience later.  YOU SHOULD EXPECT: Some feelings of bloating in the abdomen. Passage of more gas than usual.  Walking can help get rid of the air that was put into your GI tract during the procedure and reduce the bloating. If you had a lower endoscopy (such as a colonoscopy or flexible sigmoidoscopy) you may notice spotting of blood in your stool or on the toilet paper. If you underwent a bowel prep for your procedure, you may not have a normal bowel movement for a few days.  Please Note:  You might notice some irritation and congestion in your nose or some drainage.  This is from the oxygen used during your procedure.  There is no need for concern and it should clear up in a day or so.  SYMPTOMS TO REPORT IMMEDIATELY:   Following lower endoscopy (colonoscopy or flexible sigmoidoscopy):  Excessive amounts of blood in the stool  Significant tenderness or worsening of abdominal pains  Swelling of the abdomen that is new, acute  Fever of 100F or higher   Following upper endoscopy (EGD)  Vomiting of blood or coffee ground material  New chest pain or pain under the shoulder blades  Painful or persistently difficult swallowing  New shortness of breath  Fever of 100F or higher  Black, tarry-looking stools  For urgent or emergent issues, a gastroenterologist can be reached at any hour by calling [phone].   DIET:  We do recommend a small meal at first, but then  you may proceed to your regular diet.  Drink plenty of fluids but you should avoid alcoholic beverages for 24 hours.  ACTIVITY:  You should plan to take it easy for the rest of today and you should NOT DRIVE or use heavy machinery until tomorrow (because of the sedation medicines used during the test).    FOLLOW UP: Our staff will call the number listed on your records the next business day following your procedure to check on you and address any questions or concerns that you may have regarding the information given to you following your procedure. If we do not reach you, we will leave a message.  However, if you are feeling well and you are not experiencing any problems, there is no need to return our call.  We will assume that you have returned to your regular daily activities without incident.  If any biopsies were taken you will be contacted by phone or by letter within the next 1-3 weeks.  Please call us at [phone] if you have not heard about the biopsies in 3 weeks.    SIGNATURES/CONFIDENTIALITY: You and/or your care partner have signed paperwork which will be entered into your electronic medical record.  These signatures attest to the fact that that the information above on your After Visit Summary has been reviewed and is understood.  Full responsibility of the confidentiality of this discharge information lies with you and/or your care-partner.

## 2017-10-16 NOTE — Progress Notes (Signed)
Called to room to assist during endoscopic procedure.  Patient ID and intended procedure confirmed with present staff. Received instructions for my participation in the procedure from the performing physician.  

## 2017-10-16 NOTE — Progress Notes (Signed)
Pt's states no medical or surgical changes since previsit or office visit. 

## 2017-10-17 ENCOUNTER — Telehealth: Payer: Self-pay

## 2017-10-17 NOTE — Telephone Encounter (Signed)
  Follow up Call-  Call back number 10/16/2017  Post procedure Call Back phone  # [phone]  Permission to leave phone message Yes  Some recent data might be hidden     Patient questions:  Do you have a fever, pain , or abdominal swelling? No. Pain Score  0 *  Have you tolerated food without any problems? Yes.    Have you been able to return to your normal activities? Yes.    Do you have any questions about your discharge instructions: Diet   No. Medications  No. Follow up visit  No.  Do you have questions or concerns about your Care? No.  Actions: * If pain score is 4 or above: No action needed, pain <4.

## 2017-10-23 ENCOUNTER — Encounter: Payer: Self-pay | Admitting: Gastroenterology

## 2017-10-25 DIAGNOSIS — H109 Unspecified conjunctivitis: Secondary | ICD-10-CM | POA: Diagnosis not present

## 2017-10-25 DIAGNOSIS — Z6833 Body mass index (BMI) 33.0-33.9, adult: Secondary | ICD-10-CM | POA: Diagnosis not present

## 2017-10-25 DIAGNOSIS — J302 Other seasonal allergic rhinitis: Secondary | ICD-10-CM | POA: Diagnosis not present

## 2017-11-13 DIAGNOSIS — J329 Chronic sinusitis, unspecified: Secondary | ICD-10-CM | POA: Diagnosis not present

## 2017-11-13 DIAGNOSIS — Z6832 Body mass index (BMI) 32.0-32.9, adult: Secondary | ICD-10-CM | POA: Diagnosis not present

## 2017-11-13 DIAGNOSIS — J40 Bronchitis, not specified as acute or chronic: Secondary | ICD-10-CM | POA: Diagnosis not present

## 2018-01-26 DIAGNOSIS — R05 Cough: Secondary | ICD-10-CM | POA: Diagnosis not present

## 2018-01-26 DIAGNOSIS — J039 Acute tonsillitis, unspecified: Secondary | ICD-10-CM | POA: Diagnosis not present

## 2018-01-26 DIAGNOSIS — J329 Chronic sinusitis, unspecified: Secondary | ICD-10-CM | POA: Diagnosis not present

## 2018-01-26 DIAGNOSIS — J029 Acute pharyngitis, unspecified: Secondary | ICD-10-CM | POA: Diagnosis not present

## 2018-02-04 DIAGNOSIS — Z9889 Other specified postprocedural states: Secondary | ICD-10-CM | POA: Diagnosis not present

## 2018-02-04 DIAGNOSIS — I951 Orthostatic hypotension: Secondary | ICD-10-CM | POA: Diagnosis not present

## 2018-02-04 DIAGNOSIS — R001 Bradycardia, unspecified: Secondary | ICD-10-CM | POA: Diagnosis not present

## 2018-02-04 DIAGNOSIS — I480 Paroxysmal atrial fibrillation: Secondary | ICD-10-CM | POA: Diagnosis not present

## 2018-02-04 DIAGNOSIS — Z8679 Personal history of other diseases of the circulatory system: Secondary | ICD-10-CM | POA: Diagnosis not present

## 2018-02-04 DIAGNOSIS — I495 Sick sinus syndrome: Secondary | ICD-10-CM | POA: Diagnosis not present

## 2018-04-06 DIAGNOSIS — L0100 Impetigo, unspecified: Secondary | ICD-10-CM | POA: Diagnosis not present

## 2018-04-06 DIAGNOSIS — S0081XA Abrasion of other part of head, initial encounter: Secondary | ICD-10-CM | POA: Diagnosis not present

## 2018-04-08 DIAGNOSIS — L03211 Cellulitis of face: Secondary | ICD-10-CM | POA: Diagnosis not present

## 2018-05-18 DIAGNOSIS — J302 Other seasonal allergic rhinitis: Secondary | ICD-10-CM | POA: Diagnosis not present

## 2018-05-18 DIAGNOSIS — J0100 Acute maxillary sinusitis, unspecified: Secondary | ICD-10-CM | POA: Diagnosis not present

## 2018-05-19 DIAGNOSIS — H5203 Hypermetropia, bilateral: Secondary | ICD-10-CM | POA: Diagnosis not present

## 2018-05-19 DIAGNOSIS — H524 Presbyopia: Secondary | ICD-10-CM | POA: Diagnosis not present

## 2018-05-19 DIAGNOSIS — H5200 Hypermetropia, unspecified eye: Secondary | ICD-10-CM | POA: Diagnosis not present

## 2018-05-19 DIAGNOSIS — H52209 Unspecified astigmatism, unspecified eye: Secondary | ICD-10-CM | POA: Diagnosis not present

## 2018-05-25 DIAGNOSIS — J189 Pneumonia, unspecified organism: Secondary | ICD-10-CM | POA: Diagnosis not present

## 2018-06-02 DIAGNOSIS — E785 Hyperlipidemia, unspecified: Secondary | ICD-10-CM | POA: Diagnosis not present

## 2018-06-02 DIAGNOSIS — J302 Other seasonal allergic rhinitis: Secondary | ICD-10-CM | POA: Diagnosis not present

## 2018-06-02 DIAGNOSIS — Z1331 Encounter for screening for depression: Secondary | ICD-10-CM | POA: Diagnosis not present

## 2018-06-02 DIAGNOSIS — Z0000 Encounter for general adult medical examination without abnormal findings: Secondary | ICD-10-CM | POA: Diagnosis not present

## 2018-06-02 DIAGNOSIS — F419 Anxiety disorder, unspecified: Secondary | ICD-10-CM | POA: Diagnosis not present

## 2018-06-02 DIAGNOSIS — Z23 Encounter for immunization: Secondary | ICD-10-CM | POA: Diagnosis not present

## 2018-06-02 DIAGNOSIS — Z79899 Other long term (current) drug therapy: Secondary | ICD-10-CM | POA: Diagnosis not present

## 2018-06-02 DIAGNOSIS — Z6834 Body mass index (BMI) 34.0-34.9, adult: Secondary | ICD-10-CM | POA: Diagnosis not present

## 2018-06-02 DIAGNOSIS — M810 Age-related osteoporosis without current pathological fracture: Secondary | ICD-10-CM | POA: Diagnosis not present

## 2018-06-30 DIAGNOSIS — Z1231 Encounter for screening mammogram for malignant neoplasm of breast: Secondary | ICD-10-CM | POA: Diagnosis not present

## 2018-07-27 DIAGNOSIS — J019 Acute sinusitis, unspecified: Secondary | ICD-10-CM | POA: Diagnosis not present

## 2018-08-05 DIAGNOSIS — J209 Acute bronchitis, unspecified: Secondary | ICD-10-CM | POA: Diagnosis not present

## 2018-08-05 DIAGNOSIS — J0100 Acute maxillary sinusitis, unspecified: Secondary | ICD-10-CM | POA: Diagnosis not present

## 2018-08-05 DIAGNOSIS — R509 Fever, unspecified: Secondary | ICD-10-CM | POA: Diagnosis not present

## 2018-08-15 DIAGNOSIS — J069 Acute upper respiratory infection, unspecified: Secondary | ICD-10-CM | POA: Diagnosis not present

## 2018-08-28 DIAGNOSIS — R718 Other abnormality of red blood cells: Secondary | ICD-10-CM | POA: Diagnosis not present

## 2018-08-28 DIAGNOSIS — J329 Chronic sinusitis, unspecified: Secondary | ICD-10-CM | POA: Diagnosis not present

## 2018-08-28 DIAGNOSIS — J302 Other seasonal allergic rhinitis: Secondary | ICD-10-CM | POA: Diagnosis not present

## 2018-08-28 DIAGNOSIS — Z6834 Body mass index (BMI) 34.0-34.9, adult: Secondary | ICD-10-CM | POA: Diagnosis not present

## 2018-08-28 DIAGNOSIS — Z8349 Family history of other endocrine, nutritional and metabolic diseases: Secondary | ICD-10-CM | POA: Diagnosis not present

## 2018-09-24 ENCOUNTER — Telehealth: Payer: Self-pay | Admitting: Gastroenterology

## 2018-09-25 NOTE — Telephone Encounter (Signed)
I dont see where you prescribed the Omeprazole, however I do see that she is taking Omeprazole 20 mg daily. Would you like to send in the prescription?

## 2018-09-29 MED ORDER — OMEPRAZOLE 20 MG PO CPDR: 20.0000 mg | DELAYED_RELEASE_CAPSULE | Freq: Every day | ORAL | 6 refills | Status: DC

## 2018-09-29 NOTE — Telephone Encounter (Signed)
Please give omeprazole 20 mg p.o. once a day, 30 with 6 refills.

## 2019-01-13 DIAGNOSIS — Z832 Family history of diseases of the blood and blood-forming organs and certain disorders involving the immune mechanism: Secondary | ICD-10-CM | POA: Diagnosis not present

## 2019-01-13 DIAGNOSIS — D649 Anemia, unspecified: Secondary | ICD-10-CM | POA: Diagnosis not present

## 2019-02-05 DIAGNOSIS — R079 Chest pain, unspecified: Secondary | ICD-10-CM | POA: Diagnosis not present

## 2019-02-06 DIAGNOSIS — I4510 Unspecified right bundle-branch block: Secondary | ICD-10-CM | POA: Diagnosis not present

## 2019-02-26 DIAGNOSIS — R079 Chest pain, unspecified: Secondary | ICD-10-CM | POA: Diagnosis not present

## 2019-02-27 DIAGNOSIS — R079 Chest pain, unspecified: Secondary | ICD-10-CM | POA: Diagnosis not present

## 2019-03-03 DIAGNOSIS — J0100 Acute maxillary sinusitis, unspecified: Secondary | ICD-10-CM | POA: Diagnosis not present

## 2019-03-13 DIAGNOSIS — R079 Chest pain, unspecified: Secondary | ICD-10-CM | POA: Diagnosis not present

## 2019-03-20 DIAGNOSIS — I472 Ventricular tachycardia: Secondary | ICD-10-CM | POA: Diagnosis not present

## 2019-03-20 DIAGNOSIS — R079 Chest pain, unspecified: Secondary | ICD-10-CM | POA: Diagnosis not present

## 2019-03-20 DIAGNOSIS — I471 Supraventricular tachycardia: Secondary | ICD-10-CM | POA: Diagnosis not present

## 2019-03-20 DIAGNOSIS — I480 Paroxysmal atrial fibrillation: Secondary | ICD-10-CM | POA: Diagnosis not present

## 2019-03-20 DIAGNOSIS — I493 Ventricular premature depolarization: Secondary | ICD-10-CM | POA: Diagnosis not present

## 2019-03-29 DIAGNOSIS — R05 Cough: Secondary | ICD-10-CM | POA: Diagnosis not present

## 2019-03-29 DIAGNOSIS — Z20828 Contact with and (suspected) exposure to other viral communicable diseases: Secondary | ICD-10-CM | POA: Diagnosis not present

## 2019-03-29 DIAGNOSIS — J0100 Acute maxillary sinusitis, unspecified: Secondary | ICD-10-CM | POA: Diagnosis not present

## 2019-04-15 DIAGNOSIS — Z8679 Personal history of other diseases of the circulatory system: Secondary | ICD-10-CM | POA: Diagnosis not present

## 2019-04-15 DIAGNOSIS — Z9889 Other specified postprocedural states: Secondary | ICD-10-CM | POA: Diagnosis not present

## 2019-04-15 DIAGNOSIS — I495 Sick sinus syndrome: Secondary | ICD-10-CM | POA: Diagnosis not present

## 2019-04-15 DIAGNOSIS — R001 Bradycardia, unspecified: Secondary | ICD-10-CM | POA: Diagnosis not present

## 2019-04-15 DIAGNOSIS — I480 Paroxysmal atrial fibrillation: Secondary | ICD-10-CM | POA: Diagnosis not present

## 2019-04-15 DIAGNOSIS — I951 Orthostatic hypotension: Secondary | ICD-10-CM | POA: Diagnosis not present

## 2019-04-15 DIAGNOSIS — R079 Chest pain, unspecified: Secondary | ICD-10-CM | POA: Diagnosis not present

## 2019-04-24 DIAGNOSIS — F329 Major depressive disorder, single episode, unspecified: Secondary | ICD-10-CM | POA: Diagnosis not present

## 2019-04-24 DIAGNOSIS — R0789 Other chest pain: Secondary | ICD-10-CM | POA: Diagnosis not present

## 2019-04-24 DIAGNOSIS — I495 Sick sinus syndrome: Secondary | ICD-10-CM | POA: Diagnosis not present

## 2019-04-24 DIAGNOSIS — J449 Chronic obstructive pulmonary disease, unspecified: Secondary | ICD-10-CM | POA: Diagnosis not present

## 2019-04-24 DIAGNOSIS — I471 Supraventricular tachycardia: Secondary | ICD-10-CM | POA: Diagnosis not present

## 2019-04-24 DIAGNOSIS — I951 Orthostatic hypotension: Secondary | ICD-10-CM | POA: Diagnosis not present

## 2019-04-24 DIAGNOSIS — I4891 Unspecified atrial fibrillation: Secondary | ICD-10-CM | POA: Diagnosis not present

## 2019-04-26 DIAGNOSIS — I4510 Unspecified right bundle-branch block: Secondary | ICD-10-CM | POA: Diagnosis not present

## 2019-05-21 DIAGNOSIS — J0120 Acute ethmoidal sinusitis, unspecified: Secondary | ICD-10-CM | POA: Diagnosis not present

## 2019-05-21 DIAGNOSIS — Z20828 Contact with and (suspected) exposure to other viral communicable diseases: Secondary | ICD-10-CM | POA: Diagnosis not present

## 2019-06-03 DIAGNOSIS — Z20828 Contact with and (suspected) exposure to other viral communicable diseases: Secondary | ICD-10-CM | POA: Diagnosis not present

## 2019-06-03 DIAGNOSIS — J40 Bronchitis, not specified as acute or chronic: Secondary | ICD-10-CM | POA: Diagnosis not present

## 2019-06-11 DIAGNOSIS — E785 Hyperlipidemia, unspecified: Secondary | ICD-10-CM | POA: Diagnosis not present

## 2019-06-11 DIAGNOSIS — Z780 Asymptomatic menopausal state: Secondary | ICD-10-CM | POA: Diagnosis not present

## 2019-06-11 DIAGNOSIS — Z1331 Encounter for screening for depression: Secondary | ICD-10-CM | POA: Diagnosis not present

## 2019-06-11 DIAGNOSIS — F419 Anxiety disorder, unspecified: Secondary | ICD-10-CM | POA: Diagnosis not present

## 2019-06-11 DIAGNOSIS — M810 Age-related osteoporosis without current pathological fracture: Secondary | ICD-10-CM | POA: Diagnosis not present

## 2019-06-11 DIAGNOSIS — Z0000 Encounter for general adult medical examination without abnormal findings: Secondary | ICD-10-CM | POA: Diagnosis not present

## 2019-06-11 DIAGNOSIS — J40 Bronchitis, not specified as acute or chronic: Secondary | ICD-10-CM | POA: Diagnosis not present

## 2019-06-11 DIAGNOSIS — Z6834 Body mass index (BMI) 34.0-34.9, adult: Secondary | ICD-10-CM | POA: Diagnosis not present

## 2019-06-29 DIAGNOSIS — H5203 Hypermetropia, bilateral: Secondary | ICD-10-CM | POA: Diagnosis not present

## 2019-07-09 DIAGNOSIS — J321 Chronic frontal sinusitis: Secondary | ICD-10-CM | POA: Diagnosis not present

## 2019-07-09 DIAGNOSIS — B9689 Other specified bacterial agents as the cause of diseases classified elsewhere: Secondary | ICD-10-CM | POA: Diagnosis not present

## 2019-07-09 DIAGNOSIS — L089 Local infection of the skin and subcutaneous tissue, unspecified: Secondary | ICD-10-CM | POA: Diagnosis not present

## 2019-07-14 DIAGNOSIS — H353132 Nonexudative age-related macular degeneration, bilateral, intermediate dry stage: Secondary | ICD-10-CM | POA: Diagnosis not present

## 2019-07-14 DIAGNOSIS — H18413 Arcus senilis, bilateral: Secondary | ICD-10-CM | POA: Diagnosis not present

## 2019-07-14 DIAGNOSIS — H25043 Posterior subcapsular polar age-related cataract, bilateral: Secondary | ICD-10-CM | POA: Diagnosis not present

## 2019-07-14 DIAGNOSIS — H25013 Cortical age-related cataract, bilateral: Secondary | ICD-10-CM | POA: Diagnosis not present

## 2019-07-14 DIAGNOSIS — H2511 Age-related nuclear cataract, right eye: Secondary | ICD-10-CM | POA: Diagnosis not present

## 2019-07-14 DIAGNOSIS — H2513 Age-related nuclear cataract, bilateral: Secondary | ICD-10-CM | POA: Diagnosis not present

## 2019-07-21 DIAGNOSIS — Z1231 Encounter for screening mammogram for malignant neoplasm of breast: Secondary | ICD-10-CM | POA: Diagnosis not present

## 2019-07-22 DIAGNOSIS — Z9889 Other specified postprocedural states: Secondary | ICD-10-CM | POA: Diagnosis not present

## 2019-07-22 DIAGNOSIS — Z8679 Personal history of other diseases of the circulatory system: Secondary | ICD-10-CM | POA: Diagnosis not present

## 2019-07-22 DIAGNOSIS — I495 Sick sinus syndrome: Secondary | ICD-10-CM | POA: Diagnosis not present

## 2019-07-22 DIAGNOSIS — R079 Chest pain, unspecified: Secondary | ICD-10-CM | POA: Diagnosis not present

## 2019-07-22 DIAGNOSIS — I480 Paroxysmal atrial fibrillation: Secondary | ICD-10-CM | POA: Diagnosis not present

## 2019-07-22 DIAGNOSIS — R001 Bradycardia, unspecified: Secondary | ICD-10-CM | POA: Diagnosis not present

## 2019-07-22 DIAGNOSIS — I951 Orthostatic hypotension: Secondary | ICD-10-CM | POA: Diagnosis not present

## 2019-07-22 DIAGNOSIS — E876 Hypokalemia: Secondary | ICD-10-CM | POA: Diagnosis not present

## 2019-07-23 DIAGNOSIS — D509 Iron deficiency anemia, unspecified: Secondary | ICD-10-CM | POA: Diagnosis not present

## 2019-08-05 DIAGNOSIS — E876 Hypokalemia: Secondary | ICD-10-CM | POA: Diagnosis not present

## 2019-08-10 DIAGNOSIS — H2511 Age-related nuclear cataract, right eye: Secondary | ICD-10-CM | POA: Diagnosis not present

## 2019-08-11 DIAGNOSIS — H2512 Age-related nuclear cataract, left eye: Secondary | ICD-10-CM | POA: Diagnosis not present

## 2019-08-31 DIAGNOSIS — H2512 Age-related nuclear cataract, left eye: Secondary | ICD-10-CM | POA: Diagnosis not present

## 2019-09-17 ENCOUNTER — Other Ambulatory Visit: Payer: Self-pay

## 2019-09-17 MED ORDER — OMEPRAZOLE 20 MG PO CPDR: 20.0000 mg | DELAYED_RELEASE_CAPSULE | Freq: Every day | ORAL | 0 refills | Status: AC

## 2019-11-16 DIAGNOSIS — Z79899 Other long term (current) drug therapy: Secondary | ICD-10-CM | POA: Diagnosis not present

## 2019-11-30 DIAGNOSIS — K219 Gastro-esophageal reflux disease without esophagitis: Secondary | ICD-10-CM | POA: Diagnosis not present

## 2019-11-30 DIAGNOSIS — I480 Paroxysmal atrial fibrillation: Secondary | ICD-10-CM | POA: Diagnosis not present

## 2019-12-11 DIAGNOSIS — J329 Chronic sinusitis, unspecified: Secondary | ICD-10-CM | POA: Diagnosis not present

## 2019-12-11 DIAGNOSIS — J40 Bronchitis, not specified as acute or chronic: Secondary | ICD-10-CM | POA: Diagnosis not present

## 2019-12-26 DIAGNOSIS — J209 Acute bronchitis, unspecified: Secondary | ICD-10-CM | POA: Diagnosis not present

## 2019-12-26 DIAGNOSIS — J019 Acute sinusitis, unspecified: Secondary | ICD-10-CM | POA: Diagnosis not present

## 2019-12-30 DIAGNOSIS — K219 Gastro-esophageal reflux disease without esophagitis: Secondary | ICD-10-CM | POA: Diagnosis not present

## 2019-12-30 DIAGNOSIS — I480 Paroxysmal atrial fibrillation: Secondary | ICD-10-CM | POA: Diagnosis not present

## 2020-01-05 DIAGNOSIS — H9203 Otalgia, bilateral: Secondary | ICD-10-CM | POA: Diagnosis not present

## 2020-01-05 DIAGNOSIS — R0982 Postnasal drip: Secondary | ICD-10-CM | POA: Diagnosis not present

## 2020-01-26 ENCOUNTER — Other Ambulatory Visit: Payer: Self-pay | Admitting: Gastroenterology

## 2020-01-30 DIAGNOSIS — K219 Gastro-esophageal reflux disease without esophagitis: Secondary | ICD-10-CM | POA: Diagnosis not present

## 2020-01-30 DIAGNOSIS — I480 Paroxysmal atrial fibrillation: Secondary | ICD-10-CM | POA: Diagnosis not present

## 2020-02-23 DIAGNOSIS — Z20828 Contact with and (suspected) exposure to other viral communicable diseases: Secondary | ICD-10-CM | POA: Diagnosis not present

## 2020-02-23 DIAGNOSIS — J321 Chronic frontal sinusitis: Secondary | ICD-10-CM | POA: Diagnosis not present

## 2020-03-04 DIAGNOSIS — H524 Presbyopia: Secondary | ICD-10-CM | POA: Diagnosis not present

## 2020-03-04 DIAGNOSIS — H5213 Myopia, bilateral: Secondary | ICD-10-CM | POA: Diagnosis not present

## 2020-03-04 DIAGNOSIS — H52209 Unspecified astigmatism, unspecified eye: Secondary | ICD-10-CM | POA: Diagnosis not present

## 2020-03-22 DIAGNOSIS — I495 Sick sinus syndrome: Secondary | ICD-10-CM | POA: Diagnosis not present

## 2020-03-22 DIAGNOSIS — R079 Chest pain, unspecified: Secondary | ICD-10-CM | POA: Diagnosis not present

## 2020-03-22 DIAGNOSIS — Z9889 Other specified postprocedural states: Secondary | ICD-10-CM | POA: Diagnosis not present

## 2020-03-22 DIAGNOSIS — I493 Ventricular premature depolarization: Secondary | ICD-10-CM | POA: Diagnosis not present

## 2020-03-22 DIAGNOSIS — I480 Paroxysmal atrial fibrillation: Secondary | ICD-10-CM | POA: Diagnosis not present

## 2020-03-22 DIAGNOSIS — Z09 Encounter for follow-up examination after completed treatment for conditions other than malignant neoplasm: Secondary | ICD-10-CM | POA: Diagnosis not present

## 2020-03-22 DIAGNOSIS — Z8679 Personal history of other diseases of the circulatory system: Secondary | ICD-10-CM | POA: Diagnosis not present

## 2020-03-22 DIAGNOSIS — I951 Orthostatic hypotension: Secondary | ICD-10-CM | POA: Diagnosis not present

## 2020-03-31 DIAGNOSIS — I480 Paroxysmal atrial fibrillation: Secondary | ICD-10-CM | POA: Diagnosis not present

## 2020-03-31 DIAGNOSIS — K219 Gastro-esophageal reflux disease without esophagitis: Secondary | ICD-10-CM | POA: Diagnosis not present

## 2020-04-21 DIAGNOSIS — J069 Acute upper respiratory infection, unspecified: Secondary | ICD-10-CM | POA: Diagnosis not present

## 2020-04-21 DIAGNOSIS — R509 Fever, unspecified: Secondary | ICD-10-CM | POA: Diagnosis not present

## 2020-04-30 DIAGNOSIS — K219 Gastro-esophageal reflux disease without esophagitis: Secondary | ICD-10-CM | POA: Diagnosis not present

## 2020-04-30 DIAGNOSIS — J0100 Acute maxillary sinusitis, unspecified: Secondary | ICD-10-CM | POA: Diagnosis not present

## 2020-04-30 DIAGNOSIS — I480 Paroxysmal atrial fibrillation: Secondary | ICD-10-CM | POA: Diagnosis not present

## 2020-04-30 DIAGNOSIS — Z20828 Contact with and (suspected) exposure to other viral communicable diseases: Secondary | ICD-10-CM | POA: Diagnosis not present

## 2020-05-20 DIAGNOSIS — J180 Bronchopneumonia, unspecified organism: Secondary | ICD-10-CM | POA: Diagnosis not present

## 2020-05-30 NOTE — Progress Notes (Signed)
PT STABLE AT TIME OF DISCHARGE 

## 2020-06-03 ENCOUNTER — Other Ambulatory Visit: Payer: Self-pay | Admitting: Hematology and Oncology

## 2020-06-03 ENCOUNTER — Other Ambulatory Visit: Payer: Self-pay

## 2020-06-03 ENCOUNTER — Inpatient Hospital Stay: Payer: Medicare HMO | Attending: Oncology

## 2020-06-03 ENCOUNTER — Other Ambulatory Visit: Payer: Self-pay | Admitting: Oncology

## 2020-06-03 DIAGNOSIS — D649 Anemia, unspecified: Secondary | ICD-10-CM | POA: Diagnosis not present

## 2020-06-03 DIAGNOSIS — Z0001 Encounter for general adult medical examination with abnormal findings: Secondary | ICD-10-CM | POA: Diagnosis not present

## 2020-06-03 LAB — CBC AND DIFFERENTIAL
HCT: 41 (ref 36–46)
Hemoglobin: 13.8 (ref 12.0–16.0)
Neutrophils Absolute: 6.25
Platelets: 185 (ref 150–399)
WBC: 8.8

## 2020-06-03 LAB — IRON AND TIBC
Iron: 68 ug/dL (ref 28–170)
Saturation Ratios: 22 % (ref 10.4–31.8)
TIBC: 304 ug/dL (ref 250–450)
UIBC: 236 ug/dL

## 2020-06-03 LAB — CBC
MCV: 86 (ref 76–111)
RBC: 4.75 (ref 3.87–5.11)

## 2020-06-03 LAB — FERRITIN: Ferritin: 117 ng/mL (ref 11–307)

## 2020-06-05 NOTE — Progress Notes (Signed)
[facility]  [address] [place],  [place]  [address] [phone]  Clinic Day:  06/07/2020  Referring physician: Marylen Ponto, MD   HISTORY OF PRESENT ILLNESS:  The patient is a 74 y.o. 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 As her iron parameters have not been particularly elevated, she has been followed conservatively.  She comes in today for routine follow-up.  Since her last visit, the patient has been doing well.  She denies having any systemic symptoms, such as musculoskeletal discomfort, which concern her for complications related to her underlying hemochromatosis.  PHYSICAL EXAM:  Blood pressure (!) 120/58, pulse (!) 59, temperature 98.4 F (36.9 C), resp. rate 16, height 5\' 6"  (1.676 m), weight 211 lb 12.8 oz (96.1 kg), SpO2 94 %. Wt Readings from Last 3 Encounters:  06/06/20 211 lb 12.8 oz (96.1 kg)  07/23/19 212 lb 7 oz (96.4 kg)  10/16/17 205 lb (93 kg)   Body mass index is 34.19 kg/m. Performance status (ECOG): 1 Physical Exam Constitutional:      Appearance: Normal appearance. She is not ill-appearing.  HENT:     Mouth/Throat:     Mouth: Mucous membranes are moist.     Pharynx: Oropharynx is clear. No oropharyngeal exudate or posterior oropharyngeal erythema.  Cardiovascular:     Rate and Rhythm: Normal rate and regular rhythm.     Heart sounds: No murmur heard.  No friction rub. No gallop.   Pulmonary:     Effort: Pulmonary effort is normal. No respiratory distress.     Breath sounds: Normal breath sounds. No wheezing, rhonchi or rales.  Abdominal:     General: Bowel sounds are normal. There is no distension.     Palpations: Abdomen is soft. There is no mass.     Tenderness: There is no abdominal tenderness.  Musculoskeletal:        General: No swelling.     Right lower leg: No edema.     Left lower leg: No edema.  Lymphadenopathy:     Cervical: No cervical adenopathy.     Upper Body:      Right upper body: No supraclavicular or axillary adenopathy.     Left upper body: No supraclavicular or axillary adenopathy.     Lower Body: No right inguinal adenopathy. No left inguinal adenopathy.  Skin:    General: Skin is warm.     Coloration: Skin is not jaundiced.     Findings: No lesion or rash.  Neurological:     General: No focal deficit present.     Mental Status: She is alert and oriented to person, place, and time. Mental status is at baseline.     Cranial Nerves: Cranial nerves are intact.  Psychiatric:        Mood and Affect: Mood normal.        Behavior: Behavior normal.        Thought Content: Thought content normal.     LABS:   CBC Latest Ref Rng & Units 06/03/2020  WBC - 8.8  Hemoglobin 12.0 - 16.0 13.8  Hematocrit 36 - 46 41  Platelets 150 - 399 185     Ref. Range 06/03/2020 13:56  Iron Latest Ref Range: 28 - 170 ug/dL 68  UIBC Latest Units: ug/dL 14/08/2019  TIBC Latest Ref Range: 250 - 450 ug/dL 644  Saturation Ratios Latest Ref Range: 10.4 - 31.8 % 22  Ferritin Latest Ref Range: 11 -  307 ng/mL 117    ASSESSMENT & PLAN:  Assessment/Plan:  A 74 y.o. 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  When evaluating her labs, there is nothing per her iron studies which suggests she needs to be phlebotomized.  As she is only a carrier, I will stay away from phlebotomies unless her iron parameters collectively rise over time.  I will see this patient back in 1 year for repeat clinical assessment. The patient understands all the plans discussed today and is in agreement with them.    Rishika Mccollom Kirby Funk, MD

## 2020-06-06 ENCOUNTER — Other Ambulatory Visit: Payer: Self-pay

## 2020-06-06 ENCOUNTER — Inpatient Hospital Stay (INDEPENDENT_AMBULATORY_CARE_PROVIDER_SITE_OTHER): Payer: Medicare HMO | Admitting: Oncology

## 2020-06-06 ENCOUNTER — Inpatient Hospital Stay: Payer: Medicare HMO

## 2020-06-06 ENCOUNTER — Other Ambulatory Visit: Payer: Self-pay | Admitting: Oncology

## 2020-06-06 ENCOUNTER — Encounter: Payer: Self-pay | Admitting: Oncology

## 2020-06-06 ENCOUNTER — Telehealth: Payer: Self-pay | Admitting: Oncology

## 2020-06-06 NOTE — Telephone Encounter (Signed)
Per 12/6 los-55yr f/u and lab appt given to patient

## 2020-06-07 ENCOUNTER — Ambulatory Visit: Payer: Medicare HMO | Admitting: Oncology

## 2020-06-29 DIAGNOSIS — M810 Age-related osteoporosis without current pathological fracture: Secondary | ICD-10-CM | POA: Diagnosis not present

## 2020-06-29 DIAGNOSIS — E785 Hyperlipidemia, unspecified: Secondary | ICD-10-CM | POA: Diagnosis not present

## 2020-06-29 DIAGNOSIS — J321 Chronic frontal sinusitis: Secondary | ICD-10-CM | POA: Diagnosis not present

## 2020-06-29 DIAGNOSIS — Z79899 Other long term (current) drug therapy: Secondary | ICD-10-CM | POA: Diagnosis not present

## 2020-06-29 DIAGNOSIS — Z6834 Body mass index (BMI) 34.0-34.9, adult: Secondary | ICD-10-CM | POA: Diagnosis not present

## 2020-06-29 DIAGNOSIS — Z0000 Encounter for general adult medical examination without abnormal findings: Secondary | ICD-10-CM | POA: Diagnosis not present

## 2020-06-29 DIAGNOSIS — Z1331 Encounter for screening for depression: Secondary | ICD-10-CM | POA: Diagnosis not present

## 2020-08-22 DIAGNOSIS — Z6834 Body mass index (BMI) 34.0-34.9, adult: Secondary | ICD-10-CM | POA: Diagnosis not present

## 2020-08-22 DIAGNOSIS — J321 Chronic frontal sinusitis: Secondary | ICD-10-CM | POA: Diagnosis not present

## 2020-08-22 DIAGNOSIS — N6320 Unspecified lump in the left breast, unspecified quadrant: Secondary | ICD-10-CM | POA: Diagnosis not present

## 2020-09-05 DIAGNOSIS — N6321 Unspecified lump in the left breast, upper outer quadrant: Secondary | ICD-10-CM | POA: Diagnosis not present

## 2020-09-05 DIAGNOSIS — N6489 Other specified disorders of breast: Secondary | ICD-10-CM | POA: Diagnosis not present

## 2020-09-05 DIAGNOSIS — R928 Other abnormal and inconclusive findings on diagnostic imaging of breast: Secondary | ICD-10-CM | POA: Diagnosis not present

## 2020-10-29 DIAGNOSIS — I480 Paroxysmal atrial fibrillation: Secondary | ICD-10-CM | POA: Diagnosis not present

## 2020-10-29 DIAGNOSIS — K219 Gastro-esophageal reflux disease without esophagitis: Secondary | ICD-10-CM | POA: Diagnosis not present

## 2020-11-28 DIAGNOSIS — J019 Acute sinusitis, unspecified: Secondary | ICD-10-CM | POA: Diagnosis not present

## 2020-11-28 DIAGNOSIS — R0982 Postnasal drip: Secondary | ICD-10-CM | POA: Diagnosis not present

## 2020-11-28 DIAGNOSIS — Z20828 Contact with and (suspected) exposure to other viral communicable diseases: Secondary | ICD-10-CM | POA: Diagnosis not present

## 2020-12-16 DIAGNOSIS — J329 Chronic sinusitis, unspecified: Secondary | ICD-10-CM | POA: Diagnosis not present

## 2020-12-16 DIAGNOSIS — J40 Bronchitis, not specified as acute or chronic: Secondary | ICD-10-CM | POA: Diagnosis not present

## 2020-12-20 DIAGNOSIS — I951 Orthostatic hypotension: Secondary | ICD-10-CM | POA: Diagnosis not present

## 2020-12-20 DIAGNOSIS — I495 Sick sinus syndrome: Secondary | ICD-10-CM | POA: Diagnosis not present

## 2020-12-20 DIAGNOSIS — R079 Chest pain, unspecified: Secondary | ICD-10-CM | POA: Diagnosis not present

## 2020-12-20 DIAGNOSIS — I480 Paroxysmal atrial fibrillation: Secondary | ICD-10-CM | POA: Diagnosis not present

## 2020-12-20 DIAGNOSIS — Z8679 Personal history of other diseases of the circulatory system: Secondary | ICD-10-CM | POA: Diagnosis not present

## 2020-12-20 DIAGNOSIS — Z9889 Other specified postprocedural states: Secondary | ICD-10-CM | POA: Diagnosis not present

## 2020-12-21 DIAGNOSIS — R001 Bradycardia, unspecified: Secondary | ICD-10-CM | POA: Diagnosis not present

## 2020-12-29 DIAGNOSIS — I480 Paroxysmal atrial fibrillation: Secondary | ICD-10-CM | POA: Diagnosis not present

## 2020-12-29 DIAGNOSIS — K219 Gastro-esophageal reflux disease without esophagitis: Secondary | ICD-10-CM | POA: Diagnosis not present

## 2021-01-05 DIAGNOSIS — J0100 Acute maxillary sinusitis, unspecified: Secondary | ICD-10-CM | POA: Diagnosis not present

## 2021-01-05 DIAGNOSIS — S40022A Contusion of left upper arm, initial encounter: Secondary | ICD-10-CM | POA: Diagnosis not present

## 2021-01-05 DIAGNOSIS — Z20828 Contact with and (suspected) exposure to other viral communicable diseases: Secondary | ICD-10-CM | POA: Diagnosis not present

## 2021-01-05 DIAGNOSIS — S20211A Contusion of right front wall of thorax, initial encounter: Secondary | ICD-10-CM | POA: Diagnosis not present

## 2021-01-05 DIAGNOSIS — W19XXXA Unspecified fall, initial encounter: Secondary | ICD-10-CM | POA: Diagnosis not present

## 2021-02-25 DIAGNOSIS — R059 Cough, unspecified: Secondary | ICD-10-CM | POA: Diagnosis not present

## 2021-02-25 DIAGNOSIS — J069 Acute upper respiratory infection, unspecified: Secondary | ICD-10-CM | POA: Diagnosis not present

## 2021-02-25 DIAGNOSIS — Z20828 Contact with and (suspected) exposure to other viral communicable diseases: Secondary | ICD-10-CM | POA: Diagnosis not present

## 2021-03-01 DIAGNOSIS — K219 Gastro-esophageal reflux disease without esophagitis: Secondary | ICD-10-CM | POA: Diagnosis not present

## 2021-03-01 DIAGNOSIS — I10 Essential (primary) hypertension: Secondary | ICD-10-CM | POA: Diagnosis not present

## 2021-03-03 DIAGNOSIS — J329 Chronic sinusitis, unspecified: Secondary | ICD-10-CM | POA: Diagnosis not present

## 2021-03-03 DIAGNOSIS — Z20828 Contact with and (suspected) exposure to other viral communicable diseases: Secondary | ICD-10-CM | POA: Diagnosis not present

## 2021-03-14 ENCOUNTER — Emergency Department (HOSPITAL_COMMUNITY): Payer: Medicare HMO

## 2021-03-14 ENCOUNTER — Encounter (HOSPITAL_COMMUNITY): Payer: Self-pay | Admitting: Emergency Medicine

## 2021-03-14 ENCOUNTER — Emergency Department (HOSPITAL_COMMUNITY)
Admission: EM | Admit: 2021-03-14 | Discharge: 2021-03-14 | Disposition: A | Discharge status: Home / Self Care | Payer: Medicare HMO | Attending: Emergency Medicine | Admitting: Emergency Medicine

## 2021-03-14 ENCOUNTER — Other Ambulatory Visit: Payer: Self-pay

## 2021-03-14 DIAGNOSIS — M7981 Nontraumatic hematoma of soft tissue: Secondary | ICD-10-CM | POA: Diagnosis not present

## 2021-03-14 DIAGNOSIS — S0003XA Contusion of scalp, initial encounter: Secondary | ICD-10-CM | POA: Diagnosis not present

## 2021-03-14 DIAGNOSIS — R42 Dizziness and giddiness: Secondary | ICD-10-CM | POA: Diagnosis not present

## 2021-03-14 DIAGNOSIS — W19XXXA Unspecified fall, initial encounter: Secondary | ICD-10-CM | POA: Diagnosis not present

## 2021-03-14 DIAGNOSIS — M25532 Pain in left wrist: Secondary | ICD-10-CM | POA: Insufficient documentation

## 2021-03-14 DIAGNOSIS — M542 Cervicalgia: Secondary | ICD-10-CM | POA: Diagnosis not present

## 2021-03-14 DIAGNOSIS — R519 Headache, unspecified: Secondary | ICD-10-CM | POA: Diagnosis not present

## 2021-03-14 DIAGNOSIS — I6523 Occlusion and stenosis of bilateral carotid arteries: Secondary | ICD-10-CM | POA: Diagnosis not present

## 2021-03-14 DIAGNOSIS — S0083XA Contusion of other part of head, initial encounter: Secondary | ICD-10-CM | POA: Diagnosis not present

## 2021-03-14 DIAGNOSIS — S199XXA Unspecified injury of neck, initial encounter: Secondary | ICD-10-CM | POA: Diagnosis not present

## 2021-03-14 DIAGNOSIS — Z043 Encounter for examination and observation following other accident: Secondary | ICD-10-CM | POA: Diagnosis not present

## 2021-03-14 DIAGNOSIS — M25539 Pain in unspecified wrist: Secondary | ICD-10-CM | POA: Diagnosis not present

## 2021-03-14 DIAGNOSIS — S0993XA Unspecified injury of face, initial encounter: Secondary | ICD-10-CM | POA: Diagnosis not present

## 2021-03-14 LAB — CBC WITH DIFFERENTIAL/PLATELET
Abs Immature Granulocytes: 0.04 10*3/uL (ref 0.00–0.07)
Basophils Absolute: 0.1 10*3/uL (ref 0.0–0.1)
Basophils Relative: 1 %
Eosinophils Absolute: 0.1 10*3/uL (ref 0.0–0.5)
Eosinophils Relative: 1 %
HCT: 38.1 % (ref 36.0–46.0)
Hemoglobin: 12.6 g/dL (ref 12.0–15.0)
Immature Granulocytes: 0 %
Lymphocytes Relative: 9 %
Lymphs Abs: 0.8 10*3/uL (ref 0.7–4.0)
MCH: 28.4 pg (ref 26.0–34.0)
MCHC: 33.1 g/dL (ref 30.0–36.0)
MCV: 86 fL (ref 80.0–100.0)
Monocytes Absolute: 0.5 10*3/uL (ref 0.1–1.0)
Monocytes Relative: 6 %
Neutro Abs: 7.7 10*3/uL (ref 1.7–7.7)
Neutrophils Relative %: 83 %
Platelets: 194 10*3/uL (ref 150–400)
RBC: 4.43 MIL/uL (ref 3.87–5.11)
RDW: 13.1 % (ref 11.5–15.5)
WBC: 9.2 10*3/uL (ref 4.0–10.5)
nRBC: 0 % (ref 0.0–0.2)

## 2021-03-14 LAB — BASIC METABOLIC PANEL
Anion gap: 8 (ref 5–15)
BUN: 11 mg/dL (ref 8–23)
CO2: 28 mmol/L (ref 22–32)
Calcium: 9.2 mg/dL (ref 8.9–10.3)
Chloride: 105 mmol/L (ref 98–111)
Creatinine, Ser: 0.78 mg/dL (ref 0.44–1.00)
GFR, Estimated: >60 mL/min (ref >60)
Glucose, Bld: 121 mg/dL — ABNORMAL HIGH (ref 70–99)
Potassium: 2.9 mmol/L — ABNORMAL LOW (ref 3.5–5.1)
Sodium: 141 mmol/L (ref 135–145)

## 2021-03-14 MED ORDER — ACETAMINOPHEN 325 MG PO TABS
650.0000 mg | ORAL_TABLET | Freq: Once | ORAL | Status: AC
  Administered 2021-03-14: 650 mg via ORAL
  Filled 2021-03-14: qty 2

## 2021-03-14 NOTE — ED Notes (Signed)
Pt ambulatory to restroom with ED tech. Steady on feet.

## 2021-03-14 NOTE — ED Provider Notes (Signed)
[facility] EMERGENCY DEPARTMENT Provider Note   CSN: [id number] Arrival date & time: 03/14/21  1359     History No chief complaint on file.   Jane Garcia is a 75 y.o. female.  HPI Patient presents after mechanical fall.  She has multiple medical issues, but notes that she has been in her usual state of health recently.  This includes ongoing issues with low blood pressure, lightheadedness, and falls.  Today she had a typical episode, after arising from the commode felt lightheaded, fell.  She may have lost consciousness after hitting the ground, but not beforehand, and had no chest pain beforehand. She currently presents with pain in her face, left lateral neck, no weakness in any extremity.  Pain is moderate, persistent.    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 Mild gastritis. Status post empiric esophageal dilatation     OB History   No obstetric history on file.     Family History  Problem Relation Age of Onset   Colon cancer Neg Hx    Esophageal cancer Neg Hx    Stomach cancer Neg Hx    Rectal cancer Neg Hx     Social History   Tobacco Use   Smoking status: Never   Smokeless tobacco: Never   Vaping Use   Vaping Use: Never used  Substance Use Topics   Alcohol use: Never   Drug use: Never    Home Medications Prior to Admission medications   Medication Sig Start Date End Date Taking? Authorizing Provider  alendronate (FOSAMAX) 35 MG tablet Take 1 tablet by mouth once a week. 09/18/17   [provider]  aspirin 81 MG tablet Take 1 tablet by mouth daily. 08/03/15   [provider]  diphenhydrAMINE (BENADRYL ALLERGY) 25 mg capsule Take 25 mg by mouth every 6 (six) hours as needed.    [provider]  fludrocortisone (FLORINEF) 0.1 MG tablet Take 1 tablet by mouth daily. 01/17/17   [provider]  fluticasone Aleda Grana) 50 MCG/ACT nasal spray  08/14/17   [provider]  omeprazole (PRILOSEC) 20 MG capsule Take 1 capsule (20 mg total) by mouth daily. Call [phone] to schedule virtual ov for more refills 09/17/19   Lynann Bologna, MD  potassium chloride (K-DUR) 10 MEQ tablet Take 1 tablet by mouth daily. 09/16/17   [provider]  Probiotic Product (PROBIOTIC DAILY PO) Take 1 tablet by mouth daily.    [provider]  sertraline (ZOLOFT) 50 MG tablet Take 1 tablet by mouth daily. 08/13/17   [provider]  vitamin E (VITAMIN E) 400 UNIT capsule Take 400 Units by mouth daily.  [provider]    Allergies    Codeine, Latex, and Penicillins  Review of Systems   Review of Systems  Constitutional:        Per HPI, otherwise negative  HENT:         Per HPI, otherwise negative  Respiratory:         Per HPI, otherwise negative  Cardiovascular:        Per HPI, otherwise negative  Gastrointestinal:  Negative for vomiting.  Endocrine:       Negative aside from HPI  Genitourinary:        Neg aside from HPI   Musculoskeletal:        Per HPI, otherwise negative  Skin:  Positive for color change.  Neurological:  Positive for light-headedness. Negative for syncope.   Physical Exam Updated Vital  Signs BP (!) 150/74   Pulse 66   Temp 98.6 F (37 C) (Oral)   Resp 13   SpO2 98%   Physical Exam Vitals and nursing note reviewed.  Constitutional:      General: She is not in acute distress.    Appearance: She is well-developed.  HENT:     Head: Normocephalic.   Eyes:     Conjunctiva/sclera: Conjunctivae normal.  Neck:   Cardiovascular:     Rate and Rhythm: Normal rate and regular rhythm.  Pulmonary:     Effort: Pulmonary effort is normal. No respiratory distress.     Breath sounds: Normal breath sounds. No stridor.  Abdominal:     General: There is no distension.  Musculoskeletal:     Cervical back: Neck supple. Decreased range of motion.  Skin:    General: Skin is warm and dry.  Neurological:     Mental Status: She is alert and oriented to person, place, and time.     Cranial Nerves: No cranial nerve deficit.     Comments: Poor hearing otherwise cranial nerves unremarkable.  Diffuse atrophy, no tremor, no other abnormalities.  Psychiatric:        Mood and Affect: Mood normal.    ED Results / Procedures / Treatments   Labs (all labs ordered are listed, but only abnormal results are displayed) Labs Reviewed  BASIC METABOLIC PANEL - Abnormal; Notable for the following components:      Result Value   Potassium 2.9 (*)    Glucose, Bld 121 (*)    All other components within normal limits  CBC WITH DIFFERENTIAL/PLATELET    EKG EKG Interpretation  Date/Time:  Tuesday March 14 2021 14:29:42 EDT Ventricular Rate:  61 PR Interval:  152 QRS Duration: 92 QT Interval:  432 QTC Calculation: 434 R Axis:   -13 Text Interpretation: Sinus rhythm with Premature atrial complexes Incomplete right bundle branch block Minimal voltage criteria for LVH, may be normal variant ( R in aVL ) Borderline ECG Confirmed by Gerhard Munch [phone]) on 03/14/2021 7:10:06 PM  Radiology DG Wrist Complete Left  Addendum Date: 03/14/2021   ADDENDUM REPORT: 03/14/2021 15:45 ADDENDUM:  Please note there is evidence of degenerative changes but no severe degenerative changes. Electronically Signed   By: Tish Frederickson M.D.   On: 03/14/2021 15:45   Result Date: 03/14/2021 CLINICAL DATA:  Status post fall. EXAM: LEFT WRIST - COMPLETE 3+ VIEW COMPARISON:  None. FINDINGS: Diffusely decreased bone density. There is no evidence of fracture or dislocation. There is no evidence of arthropathy or other focal bone abnormality. Soft tissues are unremarkable. IMPRESSION: Diffusely decreased bone density with  no no acute displaced fracture or dislocation. Electronically Signed: By: Tish Frederickson M.D. On: 03/14/2021 15:42   DG Wrist Complete Right  Result Date: 03/14/2021 CLINICAL DATA:  Status post fall EXAM: RIGHT WRIST - COMPLETE 3+ VIEW COMPARISON:  X-ray right wrist 01/24/2012 FINDINGS: Diffusely decreased bone density. Stable cortical irregularity of the fifth metacarpal bone likely old healed fracture. There is no evidence of fracture or dislocation. There is no evidence of severe arthropathy or other focal bone abnormality. Soft tissues are unremarkable. IMPRESSION: Diffusely decreased bone density with no acute displaced fracture or dislocation. Electronically Signed   By: Tish Frederickson M.D.   On: 03/14/2021 15:44   CT HEAD WO CONTRAST ( )  Result Date: 03/14/2021 CLINICAL DATA:  Facial trauma. EXAM: CT HEAD WITHOUT CONTRAST CT MAXILLOFACIAL WITHOUT CONTRAST CT CERVICAL SPINE WITHOUT CONTRAST TECHNIQUE: Multidetector CT imaging of the head, cervical spine, and maxillofacial structures were performed using the standard protocol without intravenous contrast. Multiplanar CT image reconstructions of the cervical spine and maxillofacial structures were also generated. COMPARISON:  CT head 09/02/2017, CT neck 04/01/2014 images without report FINDINGS: CT HEAD FINDINGS Brain: No evidence of large-territorial acute infarction. No parenchymal hemorrhage. No mass lesion. No extra-axial collection.  No mass effect or midline shift. No hydrocephalus. Basilar cisterns are patent. Vascular: No hyperdense vessel. Atherosclerotic calcifications are present within the cavernous internal carotid arteries. Skull: No acute fracture or focal lesion. Other: Left frontal scalp subcutaneus soft tissue edema and 8 mm hematoma. CT MAXILLOFACIAL FINDINGS Osseous: No fracture or mandibular dislocation. No destructive process. Sinuses/Orbits: Bilateral mucosal thickening of the maxillary sinuses. Paranasal sinuses and mastoid air cells are clear. Bilateral lens replacement. Otherwise the orbits are unremarkable. Soft tissues: Least mild atherosclerotic plaque of the carotid arteries in the neck. CT CERVICAL SPINE FINDINGS Alignment: Normal. Skull base and vertebrae: Multilevel mild-to-moderate degenerative changes of the spine. No severe osseous neural foraminal or central canal stenosis. No acute fracture. Redemonstration of a densely sclerotic osseous lesion within the spinous process of the T1 vertebral body likely represents a bone island. No aggressive appearing focal osseous lesion or focal pathologic process. Soft tissues and spinal canal: No prevertebral fluid or swelling. No visible canal hematoma. Upper chest: Unremarkable. Other: Diffusely decreased bone density. IMPRESSION: 1. No acute intracranial abnormality. 2. No acute displaced facial fracture. 3. No acute displaced fracture or traumatic listhesis of the cervical spine. 4. Diffusely decreased bone density. Electronically Signed   By: Tish Frederickson M.D.   On: 03/14/2021 16:05   CT Cervical Spine Wo Contrast  Result Date: 03/14/2021 CLINICAL DATA:  Facial trauma. EXAM: CT HEAD WITHOUT CONTRAST CT MAXILLOFACIAL WITHOUT CONTRAST CT CERVICAL SPINE WITHOUT CONTRAST TECHNIQUE: Multidetector CT imaging of the head, cervical spine, and maxillofacial structures were performed using the standard protocol without intravenous contrast. Multiplanar CT image  reconstructions of the cervical spine and maxillofacial structures were also generated. COMPARISON:  CT head 09/02/2017, CT neck 04/01/2014 images without report FINDINGS: CT HEAD FINDINGS Brain: No evidence of large-territorial acute infarction. No parenchymal hemorrhage. No mass lesion. No extra-axial collection. No mass effect or midline shift. No hydrocephalus. Basilar cisterns are patent. Vascular: No hyperdense vessel. Atherosclerotic calcifications are present within the cavernous internal carotid arteries. Skull: No acute fracture or focal lesion. Other: Left frontal scalp subcutaneus soft tissue edema and 8 mm hematoma. CT MAXILLOFACIAL FINDINGS Osseous: No fracture or mandibular dislocation. No destructive process. Sinuses/Orbits: Bilateral mucosal thickening of the maxillary sinuses. Paranasal sinuses and mastoid air cells are clear. Bilateral  lens replacement. Otherwise the orbits are unremarkable. Soft tissues: Least mild atherosclerotic plaque of the carotid arteries in the neck. CT CERVICAL SPINE FINDINGS Alignment: Normal. Skull base and vertebrae: Multilevel mild-to-moderate degenerative changes of the spine. No severe osseous neural foraminal or central canal stenosis. No acute fracture. Redemonstration of a densely sclerotic osseous lesion within the spinous process of the T1 vertebral body likely represents a bone island. No aggressive appearing focal osseous lesion or focal pathologic process. Soft tissues and spinal canal: No prevertebral fluid or swelling. No visible canal hematoma. Upper chest: Unremarkable. Other: Diffusely decreased bone density. IMPRESSION: 1. No acute intracranial abnormality. 2. No acute displaced facial fracture. 3. No acute displaced fracture or traumatic listhesis of the cervical spine. 4. Diffusely decreased bone density. Electronically Signed   By: Tish Frederickson M.D.   On: 03/14/2021 16:05   CT Maxillofacial Wo Contrast  Result Date: 03/14/2021 CLINICAL DATA:   Facial trauma. EXAM: CT HEAD WITHOUT CONTRAST CT MAXILLOFACIAL WITHOUT CONTRAST CT CERVICAL SPINE WITHOUT CONTRAST TECHNIQUE: Multidetector CT imaging of the head, cervical spine, and maxillofacial structures were performed using the standard protocol without intravenous contrast. Multiplanar CT image reconstructions of the cervical spine and maxillofacial structures were also generated. COMPARISON:  CT head 09/02/2017, CT neck 04/01/2014 images without report FINDINGS: CT HEAD FINDINGS Brain: No evidence of large-territorial acute infarction. No parenchymal hemorrhage. No mass lesion. No extra-axial collection. No mass effect or midline shift. No hydrocephalus. Basilar cisterns are patent. Vascular: No hyperdense vessel. Atherosclerotic calcifications are present within the cavernous internal carotid arteries. Skull: No acute fracture or focal lesion. Other: Left frontal scalp subcutaneus soft tissue edema and 8 mm hematoma. CT MAXILLOFACIAL FINDINGS Osseous: No fracture or mandibular dislocation. No destructive process. Sinuses/Orbits: Bilateral mucosal thickening of the maxillary sinuses. Paranasal sinuses and mastoid air cells are clear. Bilateral lens replacement. Otherwise the orbits are unremarkable. Soft tissues: Least mild atherosclerotic plaque of the carotid arteries in the neck. CT CERVICAL SPINE FINDINGS Alignment: Normal. Skull base and vertebrae: Multilevel mild-to-moderate degenerative changes of the spine. No severe osseous neural foraminal or central canal stenosis. No acute fracture. Redemonstration of a densely sclerotic osseous lesion within the spinous process of the T1 vertebral body likely represents a bone island. No aggressive appearing focal osseous lesion or focal pathologic process. Soft tissues and spinal canal: No prevertebral fluid or swelling. No visible canal hematoma. Upper chest: Unremarkable. Other: Diffusely decreased bone density. IMPRESSION: 1. No acute intracranial  abnormality. 2. No acute displaced facial fracture. 3. No acute displaced fracture or traumatic listhesis of the cervical spine. 4. Diffusely decreased bone density. Electronically Signed   By: Tish Frederickson M.D.   On: 03/14/2021 16:05    Procedures Procedures   Medications Ordered in ED Medications  acetaminophen (TYLENOL) tablet 650 mg (650 mg Oral Given 03/14/21 1943)    ED Course  I have reviewed the triage vital signs and the nursing notes.  Pertinent labs & imaging results that were available during my care of the patient were reviewed by me and considered in my medical decision making (see chart for details).  Clinical Course as of 03/14/21 1950  Tue Mar 14, 2021  1811 CT HEAD WO CONTRAST ( ) [RS]    Clinical Course User Index [RS] Eartha Inch, Student-PA    Cervical collar removed, and on exam the patient has capacity to rotate laterally, flex, extend, but has left lateral neck pain in the trapezius region.  MDM Rules/Calculators/A&P Adult female presents the context of  ongoing episodes of lightheadedness, falling, after similar event today.  She is awake, alert, neurologically unremarkable on my evaluation.  She is hemodynamically unremarkable, has no EKG evidence for arrhythmia though her history of ablation in the distant past is noted. Patient's evaluation here generally reassuring, labs reviewed, CT reviewed, x-ray reviewed.  She does have ongoing mild left wrist pain and received a splint for this.  With otherwise reassuring findings, no change in hours of monitoring, patient discharged in stable condition. Final Clinical Impression(s) / ED Diagnoses Final diagnoses:  Fall, initial encounter    Rx / DC Orders ED Discharge Orders     None        Gerhard Munch, MD 03/14/21 1956

## 2021-03-14 NOTE — ED Notes (Signed)
Pt just got back to room. 

## 2021-03-14 NOTE — ED Provider Notes (Signed)
Emergency Medicine Provider Triage Evaluation Note  Jane Garcia [place] , a 75 y.o. female  was evaluated in triage.  Pt complains of fall. States she got up from going to the bathroom, got lightheaded and fell hitting her head .  Review of Systems  Positive: Lightheaded, head injury, neck pain, bilat wrist pain Negative: Back pain, hip pain  Physical Exam  BP 135/65 (BP Location: Left Arm)   Pulse 64   Temp 98.6 F (37 C) (Oral)   Resp 14   SpO2 98%  Gen:   Awake, no distress   Resp:  Normal effort  MSK:   Moves extremities without difficulty  Other:  Abrasions and ecchymosis to the face/forehead, ttp to the midline cervical spine, moving all extremities, no facial droop, perrla  Medical Decision Making  Medically screening exam initiated at 2:40 PM.  Appropriate orders placed.  Jane Garcia [place] was informed that the remainder of the evaluation will be completed by another provider, this initial triage assessment does not replace that evaluation, and the importance of remaining in the ED until their evaluation is complete.  Advised tech to apply ccollar   Karrie Meres, PA-C 03/14/21 1442    Terrilee Files, MD 03/14/21 [phone]

## 2021-03-14 NOTE — Discharge Instructions (Addendum)
As discussed, it is normal to feel worse in the days immediately following a fall regardless of medication use. ° °However, please take all medication as directed, use ice packs liberally.  If you develop any new, or concerning changes in your condition, please return here for further evaluation and management.   ° °Otherwise, please return followup with your physician ° ° ° °

## 2021-03-14 NOTE — ED Notes (Signed)
Pt d/c'd via wheelchair. GCS 15. AxO x4. Son took pt home.

## 2021-03-14 NOTE — ED Triage Notes (Signed)
Pt states she felt dizzy after taking a shower. She passed out as she was making her way to her bed. Pt fell face first on the floor. She has hematoma on left forehead, bruising to nose, scrapes on nose as well. Pt complains of headache. Bilateral wrists hurting as well.

## 2021-03-14 NOTE — ED Notes (Signed)
I introduced myself to pt. Pt AxO x4. GCS 15. Has various abraions to face and hematoma to L forehead. Bilateral wrist pain, but left hurts worse. L wrist swollen. Pt asking for pain. I told her we will have to wait on ED physician. Pt denies further needs.

## 2021-03-14 NOTE — ED Notes (Signed)
Pt not back in room yet.

## 2021-03-20 DIAGNOSIS — Z6832 Body mass index (BMI) 32.0-32.9, adult: Secondary | ICD-10-CM | POA: Diagnosis not present

## 2021-03-20 DIAGNOSIS — M25532 Pain in left wrist: Secondary | ICD-10-CM | POA: Diagnosis not present

## 2021-05-10 DIAGNOSIS — J329 Chronic sinusitis, unspecified: Secondary | ICD-10-CM | POA: Diagnosis not present

## 2021-05-30 NOTE — Progress Notes (Signed)
[facility]  [address] [place],  [place]  [address] [phone]  Clinic Day:  06/06/2021  Referring physician: Marylen Ponto, MD  This document serves as a record of services personally performed by Cheryal Salas Kirby Funk, MD. It was created on their behalf by [facility], a trained medical scribe. The creation of this record is based on the scribe's personal observations and the provider's statements to them.  HISTORY OF PRESENT ILLNESS:  The patient is a 75 y.o. 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 As her iron parameters have not been particularly elevated, she has been followed conservatively.  She comes in today for routine follow-up.  Since her last visit, the patient has been doing well.  She denies having any systemic symptoms, such as musculoskeletal discomfort, which concern her for complications related to her underlying hemochromatosis.  PHYSICAL EXAM:  Blood pressure 134/67, pulse (!) 50, temperature 97.7 F (36.5 C), resp. rate 16, height 5\' 6"  (1.676 m), weight 196 lb 3.2 oz (89 kg), SpO2 95 %. Wt Readings from Last 3 Encounters:  06/06/21 196 lb 3.2 oz (89 kg)  06/06/20 211 lb 12.8 oz (96.1 kg)  07/23/19 212 lb 7 oz (96.4 kg)   Body mass index is 31.67 kg/m. Performance status (ECOG): 1 Physical Exam Constitutional:      Appearance: Normal appearance. She is not ill-appearing.  HENT:     Mouth/Throat:     Mouth: Mucous membranes are moist.     Pharynx: Oropharynx is clear. No oropharyngeal exudate or posterior oropharyngeal erythema.  Cardiovascular:     Rate and Rhythm: Normal rate and regular rhythm.     Heart sounds: No murmur heard.   No friction rub. No gallop.  Pulmonary:     Effort: Pulmonary effort is normal. No respiratory distress.     Breath sounds: Normal breath sounds. No wheezing, rhonchi or rales.  Abdominal:     General: Bowel sounds are normal. There is no distension.      Palpations: Abdomen is soft. There is no mass.     Tenderness: There is no abdominal tenderness.  Musculoskeletal:        General: No swelling.     Right lower leg: No edema.     Left lower leg: No edema.  Lymphadenopathy:     Cervical: No cervical adenopathy.     Upper Body:     Right upper body: No supraclavicular or axillary adenopathy.     Left upper body: No supraclavicular or axillary adenopathy.     Lower Body: No right inguinal adenopathy. No left inguinal adenopathy.  Skin:    General: Skin is warm.     Coloration: Skin is not jaundiced.     Findings: No lesion or rash.  Neurological:     General: No focal deficit present.     Mental Status: She is alert and oriented to person, place, and time. Mental status is at baseline.  Psychiatric:        Mood and Affect: Mood normal.        Behavior: Behavior normal.        Thought Content: Thought content normal.    LABS:   CBC Latest Ref Rng & Units 06/05/2021 03/14/2021 06/03/2020  WBC - 6.6 9.2 8.8  Hemoglobin 12.0 - 16.0 12.8 12.6 13.8  Hematocrit 36 - 46 38 38.1 41  Platelets 150 - 399 172 194 185    Latest Reference  Range & Units 06/05/21 14:00  Iron 28 - 170 ug/dL 49  UIBC ug/dL 288  TIBC 250 - 450 ug/dL 337  Saturation Ratios 10.4 - 31.8 % 15  Ferritin 11 - 307 ng/mL 47   ASSESSMENT & PLAN:  Assessment/Plan:  A 75 y.o. 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  When evaluating her labs, there is nothing per her iron studies which suggests she needs to be phlebotomized.  As she is only a carrier, I will stay away from phlebotomies unless her iron parameters collectively rise over time.  Ultimately, she may never be phlebotomized.  Clinically, she is doing well.  I will see this patient back in 1 year for repeat clinical assessment. The patient understands all the plans discussed today and is in agreement with them.    I, Rita Ohara, am acting as scribe for Marice Potter, MD    I  have reviewed this report as typed by the medical scribe, and it is complete and accurate.  [place] Macarthur Critchley, MD

## 2021-06-05 ENCOUNTER — Inpatient Hospital Stay: Payer: Medicare HMO | Attending: Oncology

## 2021-06-05 ENCOUNTER — Other Ambulatory Visit: Payer: Self-pay | Admitting: Hematology and Oncology

## 2021-06-05 LAB — CBC AND DIFFERENTIAL
HCT: 38 (ref 36–46)
Hemoglobin: 12.8 (ref 12.0–16.0)
Neutrophils Absolute: 4.55
Platelets: 172 (ref 150–399)
WBC: 6.6

## 2021-06-05 LAB — HEPATIC FUNCTION PANEL
ALT: 20 (ref 7–35)
AST: 34 (ref 13–35)
Alkaline Phosphatase: 49 (ref 25–125)
Bilirubin, Total: 1.3

## 2021-06-05 LAB — BASIC METABOLIC PANEL
BUN: 17 (ref 4–21)
CO2: 29 — AB (ref 13–22)
Chloride: 102 (ref 99–108)
Creatinine: 0.7 (ref 0.5–1.1)
Glucose: 101
Potassium: 3.1 — AB (ref 3.4–5.3)
Sodium: 139 (ref 137–147)

## 2021-06-05 LAB — FERRITIN: Ferritin: 47 ng/mL (ref 11–307)

## 2021-06-05 LAB — COMPREHENSIVE METABOLIC PANEL
Albumin: 4.1 (ref 3.5–5.0)
Calcium: 8.7 (ref 8.7–10.7)

## 2021-06-05 LAB — IRON AND TIBC
Iron: 49 ug/dL (ref 28–170)
Saturation Ratios: 15 % (ref 10.4–31.8)
TIBC: 337 ug/dL (ref 250–450)
UIBC: 288 ug/dL

## 2021-06-05 LAB — CBC: RBC: 4.66 (ref 3.87–5.11)

## 2021-06-06 ENCOUNTER — Inpatient Hospital Stay (INDEPENDENT_AMBULATORY_CARE_PROVIDER_SITE_OTHER): Payer: Medicare HMO | Admitting: Oncology

## 2021-06-14 DIAGNOSIS — J329 Chronic sinusitis, unspecified: Secondary | ICD-10-CM | POA: Diagnosis not present

## 2021-06-19 ENCOUNTER — Telehealth: Payer: Self-pay | Admitting: Oncology

## 2021-06-19 NOTE — Telephone Encounter (Signed)
Per 12/6 LOS, patient scheduled for Dec 2023.  Gave patient Appt Summary

## 2021-06-30 DIAGNOSIS — K219 Gastro-esophageal reflux disease without esophagitis: Secondary | ICD-10-CM | POA: Diagnosis not present

## 2021-06-30 DIAGNOSIS — Z79899 Other long term (current) drug therapy: Secondary | ICD-10-CM | POA: Diagnosis not present

## 2021-06-30 DIAGNOSIS — J3089 Other allergic rhinitis: Secondary | ICD-10-CM | POA: Diagnosis not present

## 2021-06-30 DIAGNOSIS — Z6832 Body mass index (BMI) 32.0-32.9, adult: Secondary | ICD-10-CM | POA: Diagnosis not present

## 2021-06-30 DIAGNOSIS — Z0000 Encounter for general adult medical examination without abnormal findings: Secondary | ICD-10-CM | POA: Diagnosis not present

## 2021-06-30 DIAGNOSIS — M810 Age-related osteoporosis without current pathological fracture: Secondary | ICD-10-CM | POA: Diagnosis not present

## 2021-06-30 DIAGNOSIS — Z1331 Encounter for screening for depression: Secondary | ICD-10-CM | POA: Diagnosis not present

## 2021-06-30 DIAGNOSIS — L309 Dermatitis, unspecified: Secondary | ICD-10-CM | POA: Diagnosis not present

## 2021-08-10 DIAGNOSIS — Z0100 Encounter for examination of eyes and vision without abnormal findings: Secondary | ICD-10-CM | POA: Diagnosis not present

## 2021-08-10 DIAGNOSIS — H353111 Nonexudative age-related macular degeneration, right eye, early dry stage: Secondary | ICD-10-CM | POA: Diagnosis not present

## 2021-08-17 DIAGNOSIS — J329 Chronic sinusitis, unspecified: Secondary | ICD-10-CM | POA: Diagnosis not present

## 2021-08-17 DIAGNOSIS — J40 Bronchitis, not specified as acute or chronic: Secondary | ICD-10-CM | POA: Diagnosis not present

## 2021-10-03 DIAGNOSIS — H00011 Hordeolum externum right upper eyelid: Secondary | ICD-10-CM | POA: Diagnosis not present

## 2021-12-05 DIAGNOSIS — J40 Bronchitis, not specified as acute or chronic: Secondary | ICD-10-CM | POA: Diagnosis not present

## 2022-01-05 DIAGNOSIS — J329 Chronic sinusitis, unspecified: Secondary | ICD-10-CM | POA: Diagnosis not present

## 2022-02-01 DIAGNOSIS — L0201 Cutaneous abscess of face: Secondary | ICD-10-CM | POA: Diagnosis not present

## 2022-02-23 DIAGNOSIS — Z9889 Other specified postprocedural states: Secondary | ICD-10-CM | POA: Diagnosis not present

## 2022-02-23 DIAGNOSIS — I495 Sick sinus syndrome: Secondary | ICD-10-CM | POA: Diagnosis not present

## 2022-02-23 DIAGNOSIS — I951 Orthostatic hypotension: Secondary | ICD-10-CM | POA: Diagnosis not present

## 2022-02-23 DIAGNOSIS — Z8679 Personal history of other diseases of the circulatory system: Secondary | ICD-10-CM | POA: Diagnosis not present

## 2022-02-23 DIAGNOSIS — I480 Paroxysmal atrial fibrillation: Secondary | ICD-10-CM | POA: Diagnosis not present

## 2022-02-25 DIAGNOSIS — I4510 Unspecified right bundle-branch block: Secondary | ICD-10-CM | POA: Diagnosis not present

## 2022-02-25 DIAGNOSIS — I498 Other specified cardiac arrhythmias: Secondary | ICD-10-CM | POA: Diagnosis not present

## 2022-02-25 DIAGNOSIS — I480 Paroxysmal atrial fibrillation: Secondary | ICD-10-CM | POA: Diagnosis not present

## 2022-02-28 DIAGNOSIS — N390 Urinary tract infection, site not specified: Secondary | ICD-10-CM | POA: Diagnosis not present

## 2022-02-28 DIAGNOSIS — E871 Hypo-osmolality and hyponatremia: Secondary | ICD-10-CM | POA: Diagnosis not present

## 2022-02-28 DIAGNOSIS — N201 Calculus of ureter: Secondary | ICD-10-CM | POA: Diagnosis not present

## 2022-02-28 DIAGNOSIS — I700 Atherosclerosis of aorta: Secondary | ICD-10-CM | POA: Diagnosis not present

## 2022-02-28 DIAGNOSIS — I509 Heart failure, unspecified: Secondary | ICD-10-CM | POA: Diagnosis not present

## 2022-02-28 DIAGNOSIS — N1330 Unspecified hydronephrosis: Secondary | ICD-10-CM | POA: Diagnosis not present

## 2022-02-28 DIAGNOSIS — N136 Pyonephrosis: Secondary | ICD-10-CM | POA: Diagnosis not present

## 2022-02-28 DIAGNOSIS — B9689 Other specified bacterial agents as the cause of diseases classified elsewhere: Secondary | ICD-10-CM | POA: Diagnosis not present

## 2022-02-28 DIAGNOSIS — N12 Tubulo-interstitial nephritis, not specified as acute or chronic: Secondary | ICD-10-CM | POA: Diagnosis not present

## 2022-02-28 DIAGNOSIS — B029 Zoster without complications: Secondary | ICD-10-CM | POA: Diagnosis not present

## 2022-02-28 DIAGNOSIS — Z9049 Acquired absence of other specified parts of digestive tract: Secondary | ICD-10-CM | POA: Diagnosis not present

## 2022-02-28 DIAGNOSIS — A419 Sepsis, unspecified organism: Secondary | ICD-10-CM | POA: Diagnosis not present

## 2022-02-28 DIAGNOSIS — E860 Dehydration: Secondary | ICD-10-CM | POA: Diagnosis not present

## 2022-02-28 DIAGNOSIS — R1110 Vomiting, unspecified: Secondary | ICD-10-CM | POA: Diagnosis not present

## 2022-02-28 DIAGNOSIS — Z466 Encounter for fitting and adjustment of urinary device: Secondary | ICD-10-CM | POA: Diagnosis not present

## 2022-02-28 DIAGNOSIS — E876 Hypokalemia: Secondary | ICD-10-CM | POA: Diagnosis not present

## 2022-02-28 DIAGNOSIS — R109 Unspecified abdominal pain: Secondary | ICD-10-CM | POA: Diagnosis not present

## 2022-02-28 DIAGNOSIS — I110 Hypertensive heart disease with heart failure: Secondary | ICD-10-CM | POA: Diagnosis not present

## 2022-02-28 DIAGNOSIS — A4159 Other Gram-negative sepsis: Secondary | ICD-10-CM | POA: Diagnosis not present

## 2022-02-28 DIAGNOSIS — N132 Hydronephrosis with renal and ureteral calculous obstruction: Secondary | ICD-10-CM | POA: Diagnosis not present

## 2022-02-28 DIAGNOSIS — I5032 Chronic diastolic (congestive) heart failure: Secondary | ICD-10-CM | POA: Diagnosis not present

## 2022-03-01 DIAGNOSIS — E871 Hypo-osmolality and hyponatremia: Secondary | ICD-10-CM | POA: Diagnosis not present

## 2022-03-01 DIAGNOSIS — N132 Hydronephrosis with renal and ureteral calculous obstruction: Secondary | ICD-10-CM | POA: Diagnosis not present

## 2022-03-01 DIAGNOSIS — N136 Pyonephrosis: Secondary | ICD-10-CM | POA: Diagnosis not present

## 2022-03-01 DIAGNOSIS — A4159 Other Gram-negative sepsis: Secondary | ICD-10-CM | POA: Diagnosis not present

## 2022-03-02 DIAGNOSIS — Z466 Encounter for fitting and adjustment of urinary device: Secondary | ICD-10-CM | POA: Diagnosis not present

## 2022-03-02 DIAGNOSIS — N132 Hydronephrosis with renal and ureteral calculous obstruction: Secondary | ICD-10-CM | POA: Diagnosis not present

## 2022-03-02 DIAGNOSIS — E871 Hypo-osmolality and hyponatremia: Secondary | ICD-10-CM | POA: Diagnosis not present

## 2022-03-02 DIAGNOSIS — N136 Pyonephrosis: Secondary | ICD-10-CM | POA: Diagnosis not present

## 2022-03-02 DIAGNOSIS — Z9049 Acquired absence of other specified parts of digestive tract: Secondary | ICD-10-CM | POA: Diagnosis not present

## 2022-03-02 DIAGNOSIS — N201 Calculus of ureter: Secondary | ICD-10-CM | POA: Diagnosis not present

## 2022-03-02 DIAGNOSIS — N12 Tubulo-interstitial nephritis, not specified as acute or chronic: Secondary | ICD-10-CM | POA: Diagnosis not present

## 2022-03-02 DIAGNOSIS — A4159 Other Gram-negative sepsis: Secondary | ICD-10-CM | POA: Diagnosis not present

## 2022-03-03 DIAGNOSIS — A4159 Other Gram-negative sepsis: Secondary | ICD-10-CM | POA: Diagnosis not present

## 2022-03-03 DIAGNOSIS — N136 Pyonephrosis: Secondary | ICD-10-CM | POA: Diagnosis not present

## 2022-03-03 DIAGNOSIS — E871 Hypo-osmolality and hyponatremia: Secondary | ICD-10-CM | POA: Diagnosis not present

## 2022-03-04 DIAGNOSIS — N136 Pyonephrosis: Secondary | ICD-10-CM | POA: Diagnosis not present

## 2022-03-04 DIAGNOSIS — E871 Hypo-osmolality and hyponatremia: Secondary | ICD-10-CM | POA: Diagnosis not present

## 2022-03-04 DIAGNOSIS — A4159 Other Gram-negative sepsis: Secondary | ICD-10-CM | POA: Diagnosis not present

## 2022-03-05 DIAGNOSIS — N132 Hydronephrosis with renal and ureteral calculous obstruction: Secondary | ICD-10-CM | POA: Diagnosis not present

## 2022-03-05 DIAGNOSIS — N12 Tubulo-interstitial nephritis, not specified as acute or chronic: Secondary | ICD-10-CM | POA: Diagnosis not present

## 2022-03-05 DIAGNOSIS — E871 Hypo-osmolality and hyponatremia: Secondary | ICD-10-CM | POA: Diagnosis not present

## 2022-03-05 DIAGNOSIS — N136 Pyonephrosis: Secondary | ICD-10-CM | POA: Diagnosis not present

## 2022-03-05 DIAGNOSIS — A4159 Other Gram-negative sepsis: Secondary | ICD-10-CM | POA: Diagnosis not present

## 2022-03-06 DIAGNOSIS — E871 Hypo-osmolality and hyponatremia: Secondary | ICD-10-CM | POA: Diagnosis not present

## 2022-03-06 DIAGNOSIS — A4159 Other Gram-negative sepsis: Secondary | ICD-10-CM | POA: Diagnosis not present

## 2022-03-06 DIAGNOSIS — N136 Pyonephrosis: Secondary | ICD-10-CM | POA: Diagnosis not present

## 2022-03-07 DIAGNOSIS — A4159 Other Gram-negative sepsis: Secondary | ICD-10-CM | POA: Diagnosis not present

## 2022-03-07 DIAGNOSIS — N136 Pyonephrosis: Secondary | ICD-10-CM | POA: Diagnosis not present

## 2022-03-07 DIAGNOSIS — E871 Hypo-osmolality and hyponatremia: Secondary | ICD-10-CM | POA: Diagnosis not present

## 2022-03-08 DIAGNOSIS — R1032 Left lower quadrant pain: Secondary | ICD-10-CM | POA: Diagnosis not present

## 2022-03-08 DIAGNOSIS — N201 Calculus of ureter: Secondary | ICD-10-CM | POA: Diagnosis not present

## 2022-03-08 DIAGNOSIS — A4159 Other Gram-negative sepsis: Secondary | ICD-10-CM | POA: Diagnosis not present

## 2022-03-08 DIAGNOSIS — D649 Anemia, unspecified: Secondary | ICD-10-CM | POA: Diagnosis not present

## 2022-03-08 DIAGNOSIS — N12 Tubulo-interstitial nephritis, not specified as acute or chronic: Secondary | ICD-10-CM | POA: Diagnosis not present

## 2022-03-08 DIAGNOSIS — I878 Other specified disorders of veins: Secondary | ICD-10-CM | POA: Diagnosis not present

## 2022-03-08 DIAGNOSIS — N390 Urinary tract infection, site not specified: Secondary | ICD-10-CM | POA: Diagnosis not present

## 2022-03-08 DIAGNOSIS — N132 Hydronephrosis with renal and ureteral calculous obstruction: Secondary | ICD-10-CM | POA: Diagnosis not present

## 2022-03-08 DIAGNOSIS — N200 Calculus of kidney: Secondary | ICD-10-CM | POA: Diagnosis not present

## 2022-03-08 DIAGNOSIS — R262 Difficulty in walking, not elsewhere classified: Secondary | ICD-10-CM | POA: Diagnosis not present

## 2022-03-08 DIAGNOSIS — N136 Pyonephrosis: Secondary | ICD-10-CM | POA: Diagnosis not present

## 2022-03-08 DIAGNOSIS — A419 Sepsis, unspecified organism: Secondary | ICD-10-CM | POA: Diagnosis not present

## 2022-03-08 DIAGNOSIS — E871 Hypo-osmolality and hyponatremia: Secondary | ICD-10-CM | POA: Diagnosis not present

## 2022-03-13 DIAGNOSIS — R262 Difficulty in walking, not elsewhere classified: Secondary | ICD-10-CM | POA: Diagnosis not present

## 2022-03-13 DIAGNOSIS — D649 Anemia, unspecified: Secondary | ICD-10-CM | POA: Diagnosis not present

## 2022-03-13 DIAGNOSIS — N12 Tubulo-interstitial nephritis, not specified as acute or chronic: Secondary | ICD-10-CM | POA: Diagnosis not present

## 2022-03-13 DIAGNOSIS — N132 Hydronephrosis with renal and ureteral calculous obstruction: Secondary | ICD-10-CM | POA: Diagnosis not present

## 2022-03-21 DIAGNOSIS — N390 Urinary tract infection, site not specified: Secondary | ICD-10-CM | POA: Diagnosis not present

## 2022-03-21 DIAGNOSIS — N200 Calculus of kidney: Secondary | ICD-10-CM | POA: Diagnosis not present

## 2022-03-21 DIAGNOSIS — I878 Other specified disorders of veins: Secondary | ICD-10-CM | POA: Diagnosis not present

## 2022-03-21 DIAGNOSIS — R1032 Left lower quadrant pain: Secondary | ICD-10-CM | POA: Diagnosis not present

## 2022-03-21 DIAGNOSIS — N201 Calculus of ureter: Secondary | ICD-10-CM | POA: Diagnosis not present

## 2022-03-27 DIAGNOSIS — N200 Calculus of kidney: Secondary | ICD-10-CM | POA: Diagnosis not present

## 2022-03-27 DIAGNOSIS — R9389 Abnormal findings on diagnostic imaging of other specified body structures: Secondary | ICD-10-CM | POA: Diagnosis not present

## 2022-03-27 DIAGNOSIS — I959 Hypotension, unspecified: Secondary | ICD-10-CM | POA: Diagnosis not present

## 2022-03-27 DIAGNOSIS — Q8909 Congenital malformations of spleen: Secondary | ICD-10-CM | POA: Diagnosis not present

## 2022-03-29 DIAGNOSIS — N136 Pyonephrosis: Secondary | ICD-10-CM | POA: Diagnosis not present

## 2022-03-29 DIAGNOSIS — A419 Sepsis, unspecified organism: Secondary | ICD-10-CM | POA: Diagnosis not present

## 2022-03-30 DIAGNOSIS — N202 Calculus of kidney with calculus of ureter: Secondary | ICD-10-CM | POA: Diagnosis not present

## 2022-03-30 DIAGNOSIS — I509 Heart failure, unspecified: Secondary | ICD-10-CM | POA: Diagnosis not present

## 2022-03-30 DIAGNOSIS — Z880 Allergy status to penicillin: Secondary | ICD-10-CM | POA: Diagnosis not present

## 2022-03-30 DIAGNOSIS — Z7982 Long term (current) use of aspirin: Secondary | ICD-10-CM | POA: Diagnosis not present

## 2022-03-30 DIAGNOSIS — Z79899 Other long term (current) drug therapy: Secondary | ICD-10-CM | POA: Diagnosis not present

## 2022-03-30 DIAGNOSIS — I4891 Unspecified atrial fibrillation: Secondary | ICD-10-CM | POA: Diagnosis not present

## 2022-03-30 DIAGNOSIS — N201 Calculus of ureter: Secondary | ICD-10-CM | POA: Diagnosis not present

## 2022-03-30 DIAGNOSIS — Z466 Encounter for fitting and adjustment of urinary device: Secondary | ICD-10-CM | POA: Diagnosis not present

## 2022-03-30 DIAGNOSIS — K219 Gastro-esophageal reflux disease without esophagitis: Secondary | ICD-10-CM | POA: Diagnosis not present

## 2022-03-30 DIAGNOSIS — Z888 Allergy status to other drugs, medicaments and biological substances status: Secondary | ICD-10-CM | POA: Diagnosis not present

## 2022-04-06 DIAGNOSIS — N200 Calculus of kidney: Secondary | ICD-10-CM | POA: Diagnosis not present

## 2022-04-06 DIAGNOSIS — R110 Nausea: Secondary | ICD-10-CM | POA: Diagnosis not present

## 2022-04-06 DIAGNOSIS — R1032 Left lower quadrant pain: Secondary | ICD-10-CM | POA: Diagnosis not present

## 2022-04-06 DIAGNOSIS — N390 Urinary tract infection, site not specified: Secondary | ICD-10-CM | POA: Diagnosis not present

## 2022-04-11 DIAGNOSIS — R1032 Left lower quadrant pain: Secondary | ICD-10-CM | POA: Diagnosis not present

## 2022-04-11 DIAGNOSIS — N202 Calculus of kidney with calculus of ureter: Secondary | ICD-10-CM | POA: Diagnosis not present

## 2022-04-11 DIAGNOSIS — Z466 Encounter for fitting and adjustment of urinary device: Secondary | ICD-10-CM | POA: Diagnosis not present

## 2022-04-11 DIAGNOSIS — N390 Urinary tract infection, site not specified: Secondary | ICD-10-CM | POA: Diagnosis not present

## 2022-04-11 DIAGNOSIS — N200 Calculus of kidney: Secondary | ICD-10-CM | POA: Diagnosis not present

## 2022-04-11 DIAGNOSIS — Z960 Presence of urogenital implants: Secondary | ICD-10-CM | POA: Diagnosis not present

## 2022-04-20 DIAGNOSIS — N1330 Unspecified hydronephrosis: Secondary | ICD-10-CM | POA: Diagnosis not present

## 2022-04-20 DIAGNOSIS — R9389 Abnormal findings on diagnostic imaging of other specified body structures: Secondary | ICD-10-CM | POA: Diagnosis not present

## 2022-04-20 DIAGNOSIS — D7389 Other diseases of spleen: Secondary | ICD-10-CM | POA: Diagnosis not present

## 2022-04-20 DIAGNOSIS — Z9049 Acquired absence of other specified parts of digestive tract: Secondary | ICD-10-CM | POA: Diagnosis not present

## 2022-05-03 IMAGING — CT CT HEAD W/O CM
3 of 4 series · 15 of 47 positions shown, 18 images · non-contrast
Comparison: CT head 09/02/2017, CT neck 04/01/2014 images without
report

CLINICAL DATA: Facial trauma.

EXAM:
CT HEAD WITHOUT CONTRAST
CT MAXILLOFACIAL WITHOUT CONTRAST
CT CERVICAL SPINE WITHOUT CONTRAST
TECHNIQUE: Multidetector CT imaging of the head, cervical spine, and
maxillofacial structures were performed using the standard protocol
without intravenous contrast. Multiplanar CT image reconstructions
of the cervical spine and maxillofacial structures were also
generated.

[Series 4: head 2.0 h70h · axial · 0.44mm/px · z∈[-141,-9]mm · 9 of 84 slices shown, 12 images]
[im 9/84  brain]
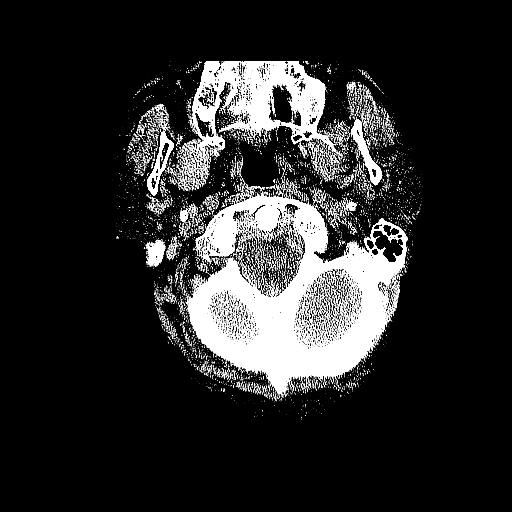
[im 9/84  bone]
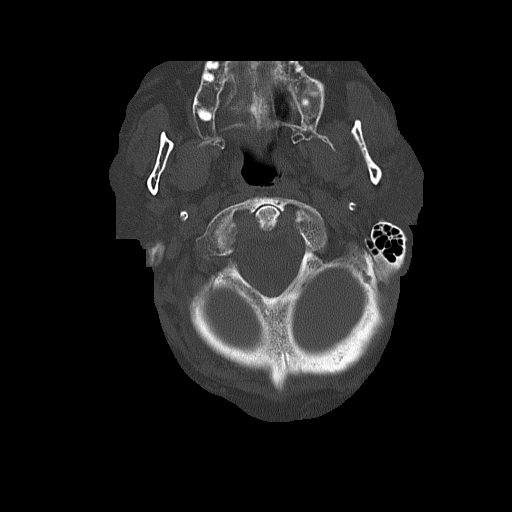
[im 17/84  brain]
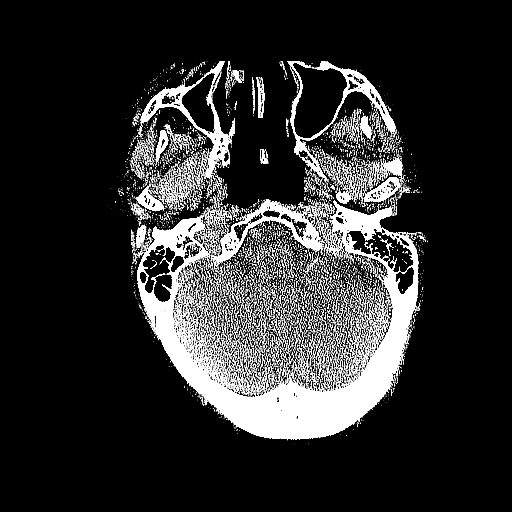
[im 25/84  brain]
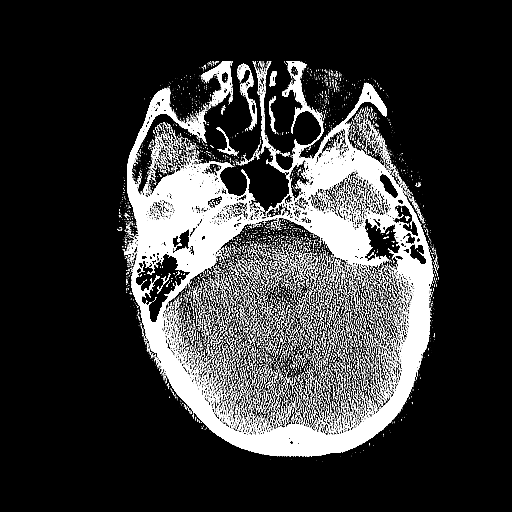
[im 34/84  brain]
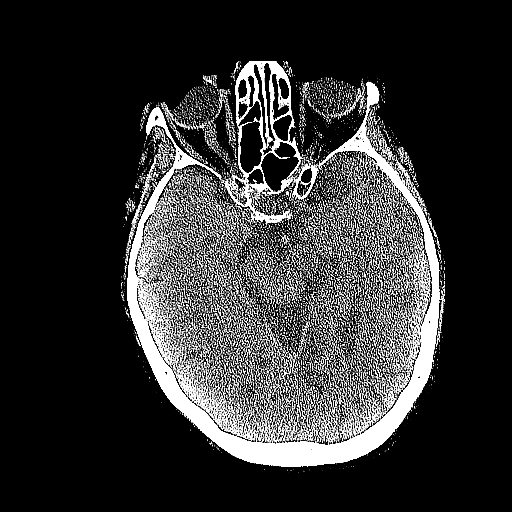
[im 42/84  brain]
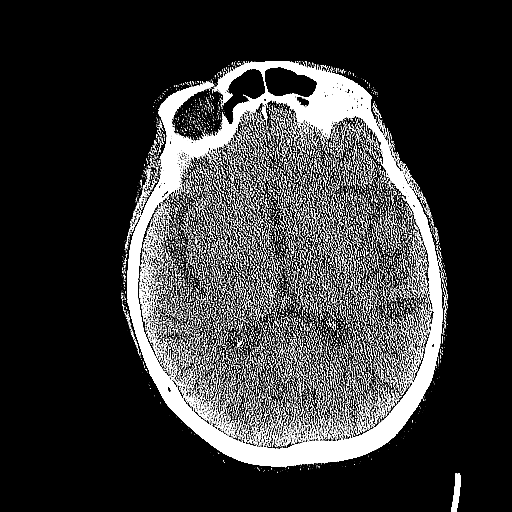
[im 42/84  bone]
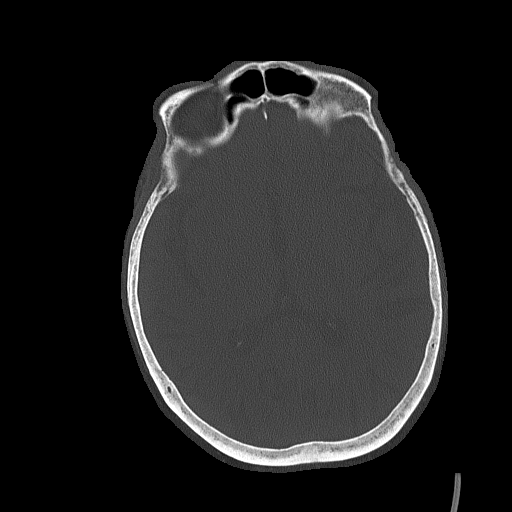
[im 50/84  brain]
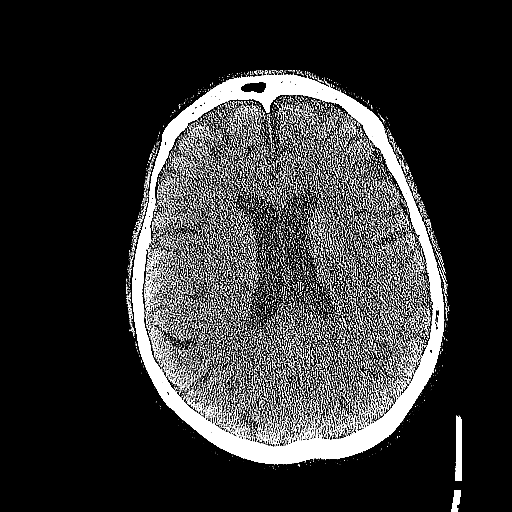
[im 59/84  brain]
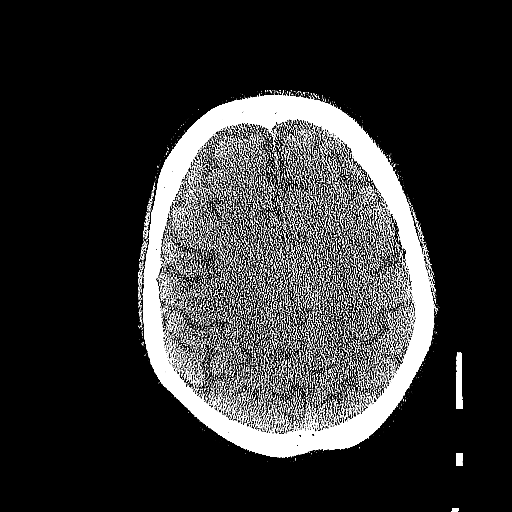
[im 67/84  brain]
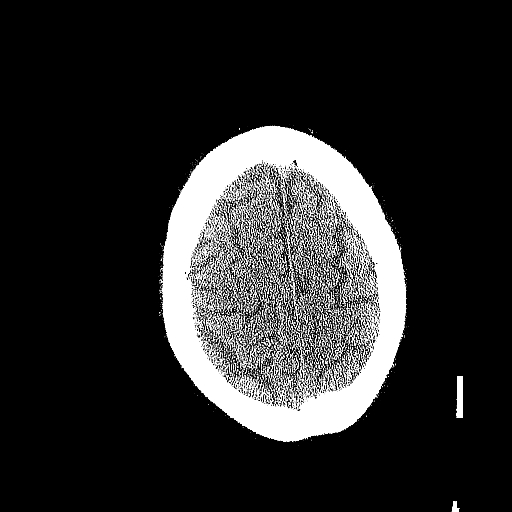
[im 75/84  brain]
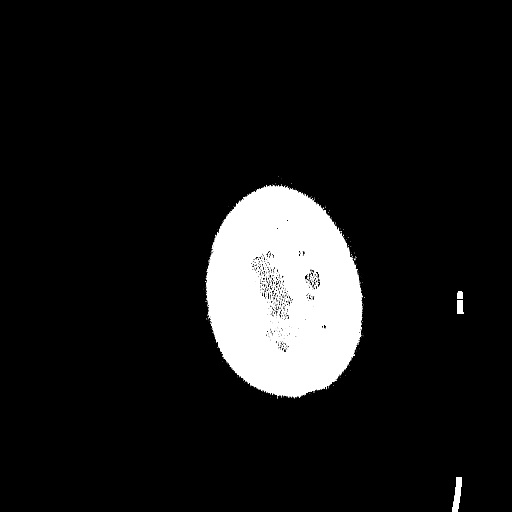
[im 75/84  bone]
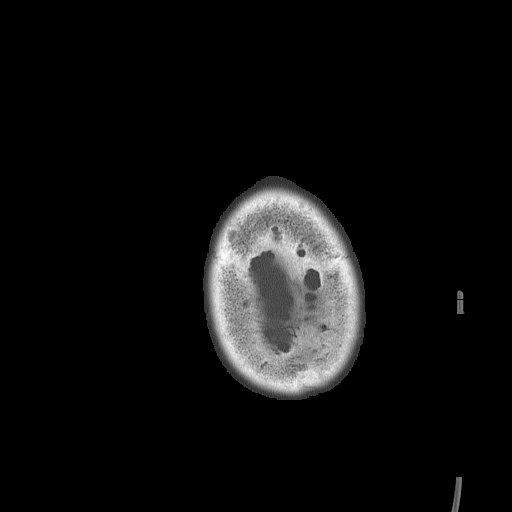

[Series 6: head 3.0 mpr cor · coronal · 0.33mm/px · 3 of 67 slices shown]
[im 23/67  brain]
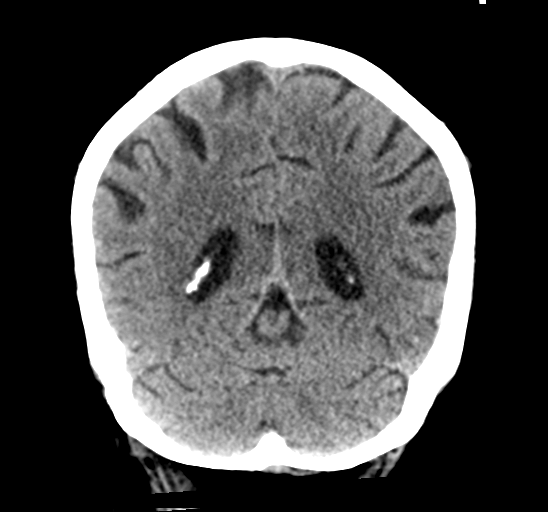
[im 30/67  brain]
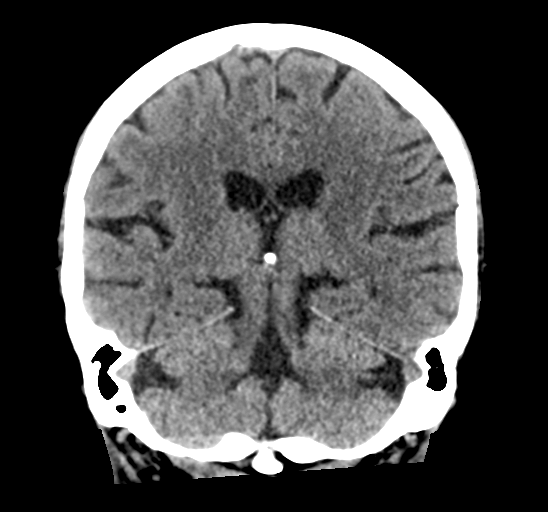
[im 37/67  brain]
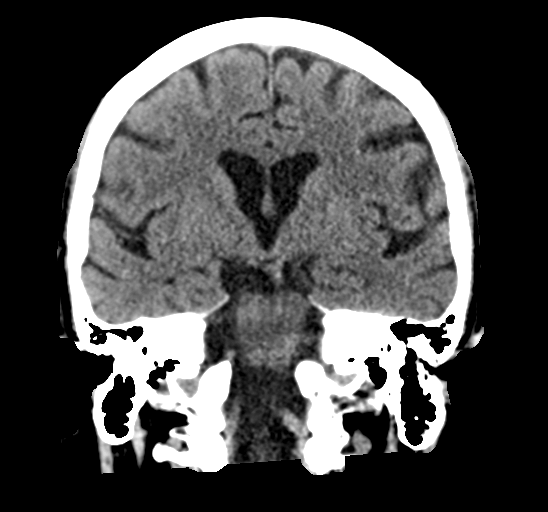

[Series 7: head 3.0 mpr sag · sagittal · 0.33mm/px · 3 of 54 slices shown]
[im 18/54  brain]
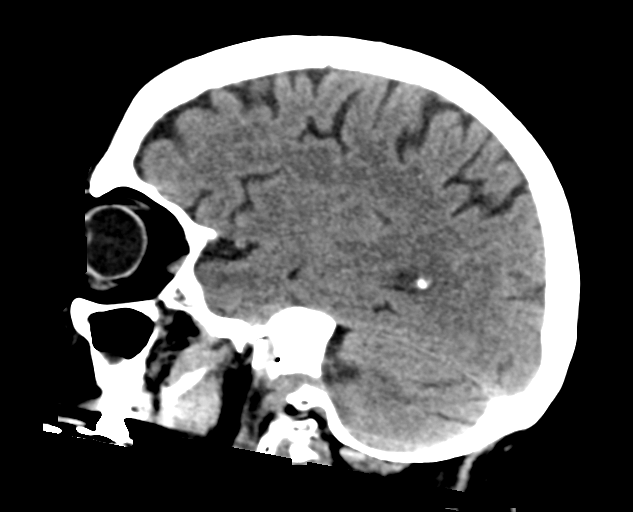
[im 27/54  brain]
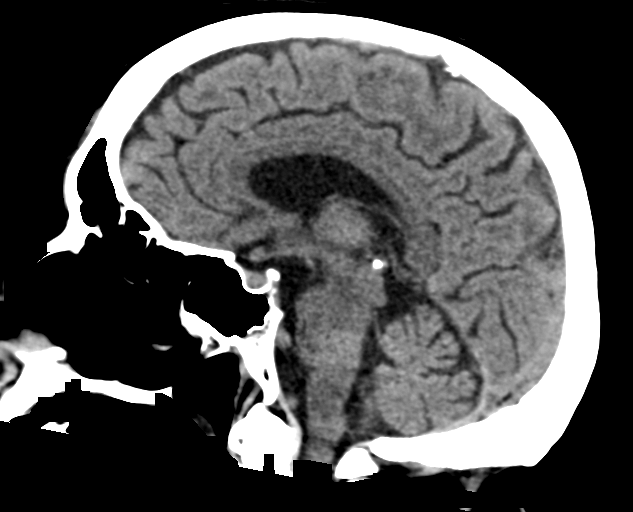
[im 36/54  brain]
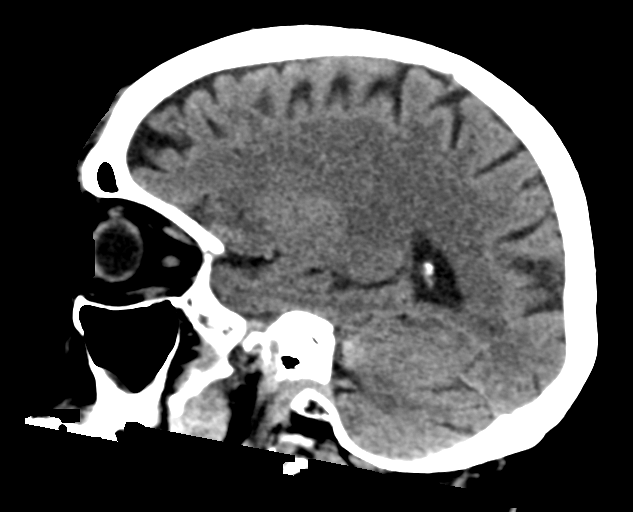

[15 of 47 positions shown; findings below may reference images not displayed]

FINDINGS: CT HEAD FINDINGS

Brain:

No evidence of large-territorial acute infarction. No parenchymal
hemorrhage. No mass lesion. No extra-axial collection.

No mass effect or midline shift. No hydrocephalus. Basilar cisterns
are patent.

Vascular: No hyperdense vessel. Atherosclerotic calcifications are
present within the cavernous internal carotid arteries.

Skull: No acute fracture or focal lesion.

Other: Left frontal scalp subcutaneus soft tissue edema and 8 mm
hematoma.

CT MAXILLOFACIAL FINDINGS

Osseous: No fracture or mandibular dislocation. No destructive
process.

Sinuses/Orbits: Bilateral mucosal thickening of the maxillary
sinuses. Paranasal sinuses and mastoid air cells are clear.
Bilateral lens replacement. Otherwise the orbits are unremarkable.

Soft tissues: Least mild atherosclerotic plaque of the carotid
arteries in the neck.

CT CERVICAL SPINE FINDINGS

Alignment: Normal.

Skull base and vertebrae: Multilevel mild-to-moderate degenerative
changes of the spine. No severe osseous neural foraminal or central
canal stenosis. No acute fracture. Redemonstration of a densely
sclerotic osseous lesion within the spinous process of the T1
vertebral body likely represents a bone island. No aggressive
appearing focal osseous lesion or focal pathologic process.

Soft tissues and spinal canal: No prevertebral fluid or swelling. No
visible canal hematoma.

Upper chest: Unremarkable.

Other: Diffusely decreased bone density.
IMPRESSION: 1. No acute intracranial abnormality.
2. No acute displaced facial fracture.
3. No acute displaced fracture or traumatic listhesis of the
cervical spine.
4. Diffusely decreased bone density.

## 2022-05-03 IMAGING — DX DG WRIST COMPLETE 3+V*R*
4 series · 4 of 4 positions shown · non-contrast
Comparison: X-ray right wrist 01/24/2012

CLINICAL DATA: Status post fall

EXAM:
RIGHT WRIST - COMPLETE 3+ VIEW

[wrist pa]
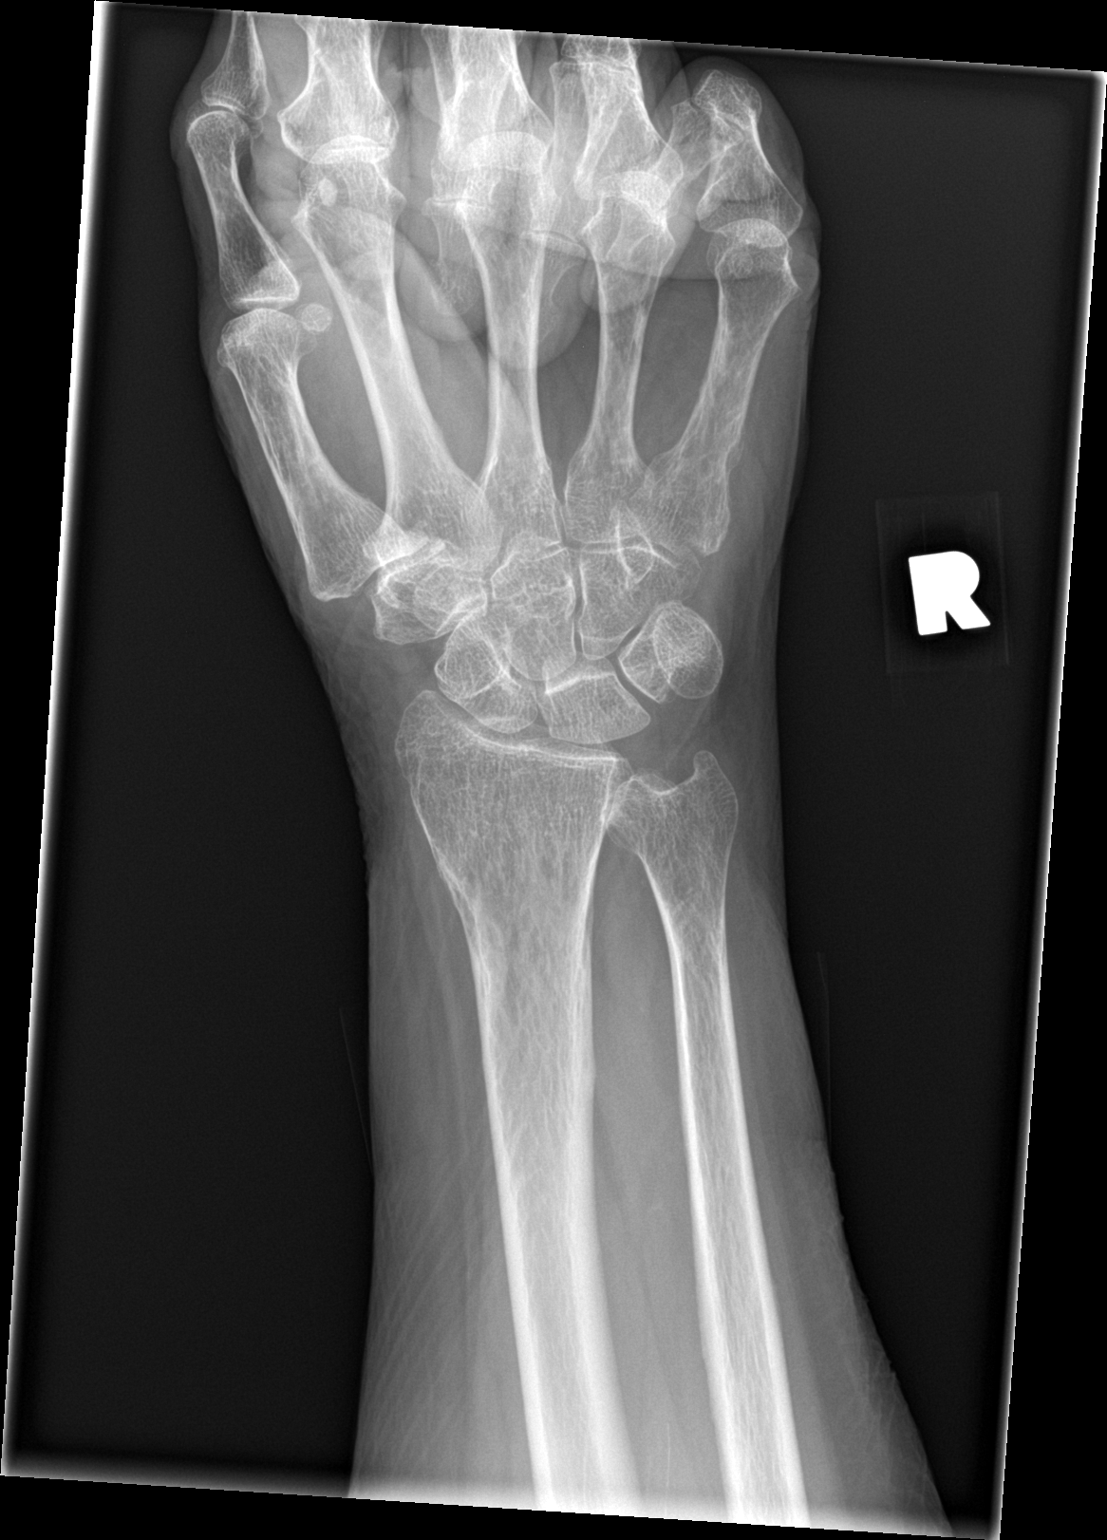

[wrist obl]
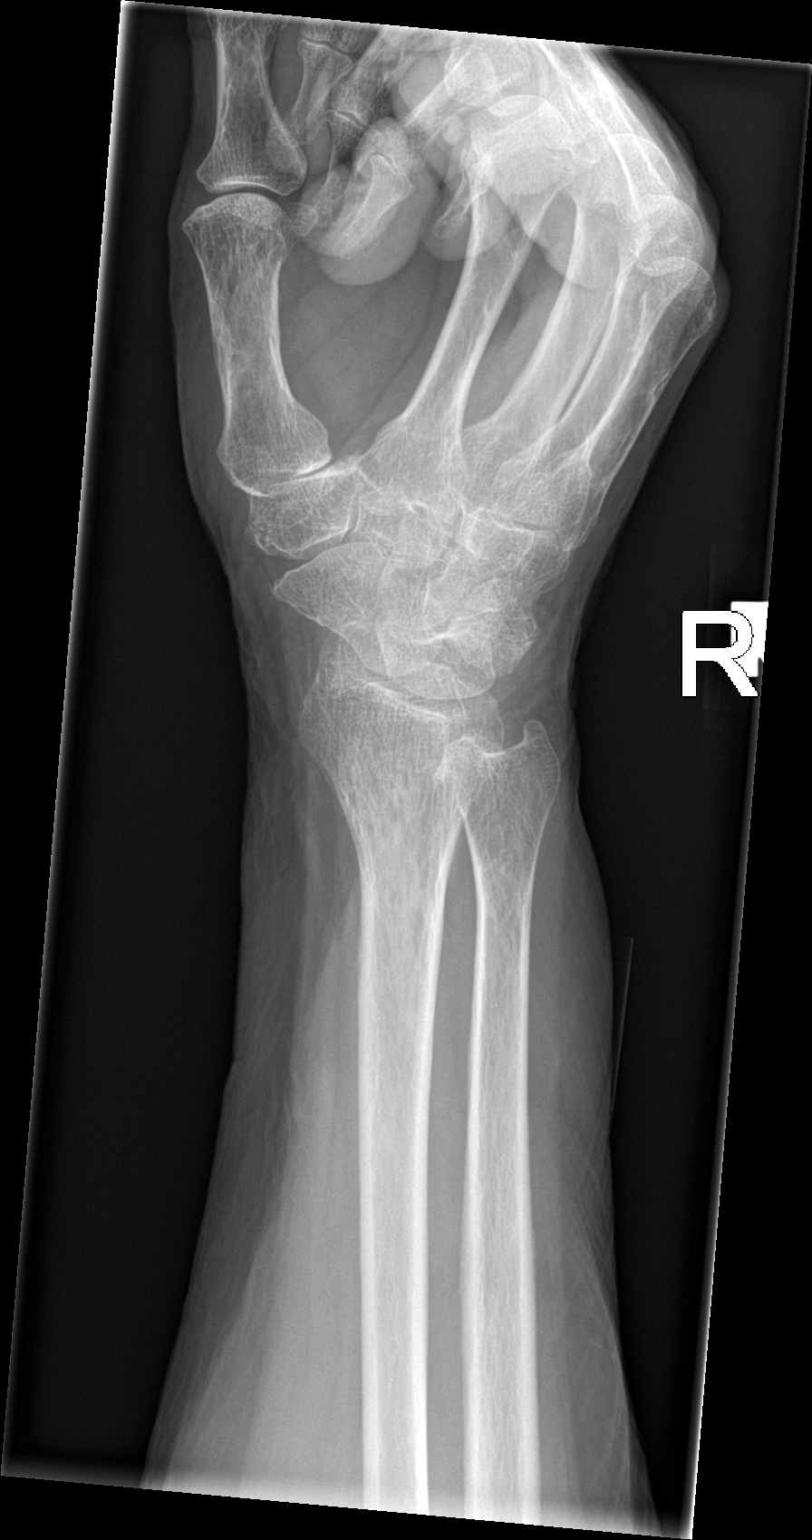

[wrist lat]
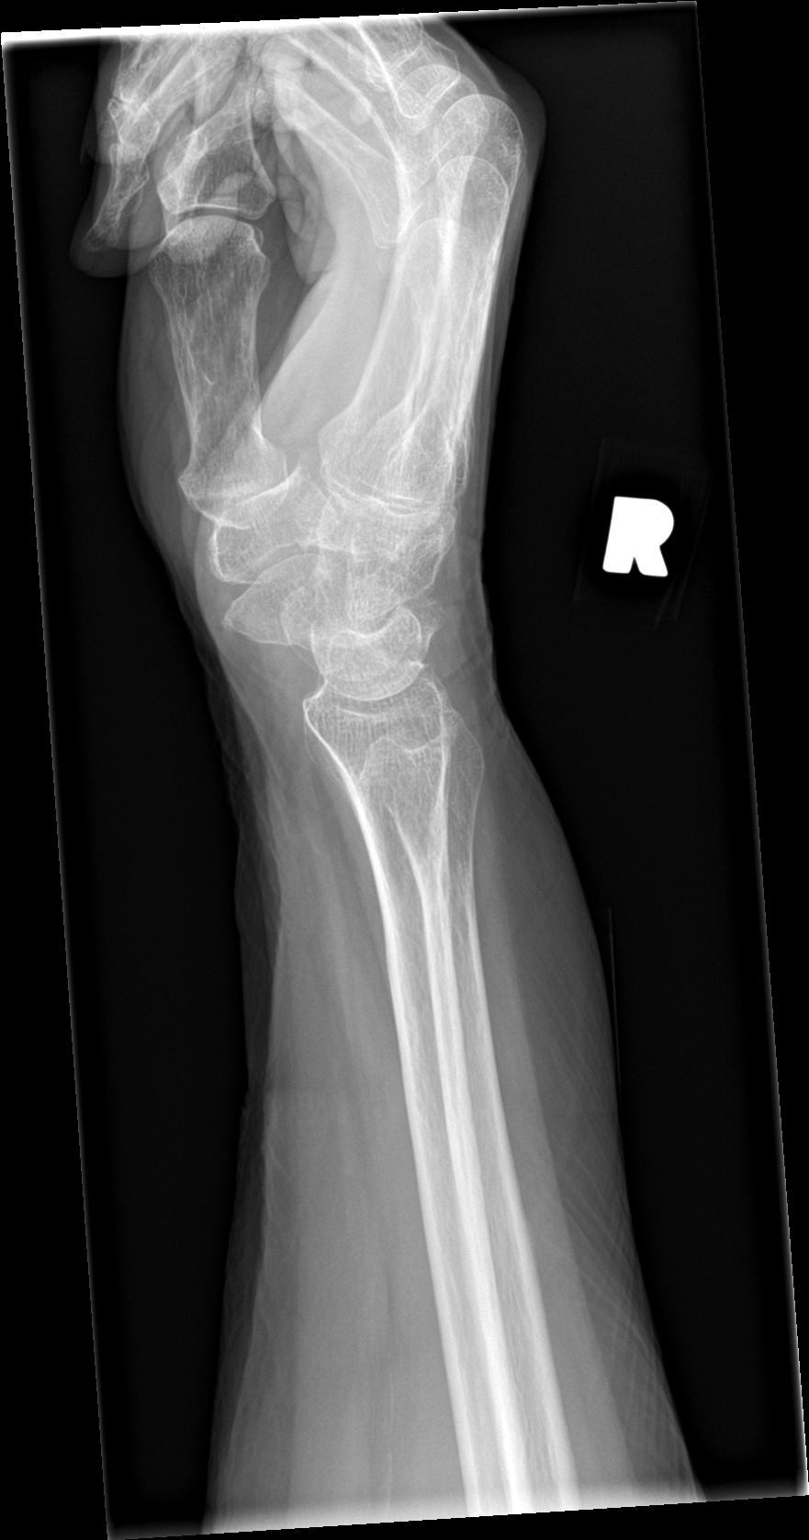

[wrist navicular]
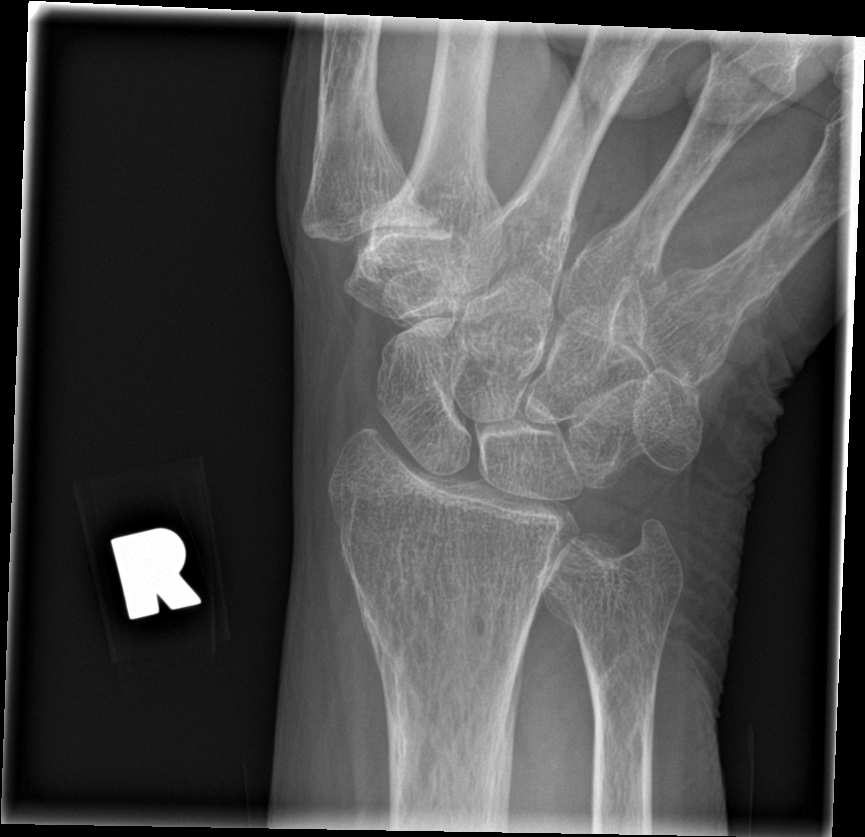

[4 of 4 positions shown; findings below may reference images not displayed]

FINDINGS: Diffusely decreased bone density. Stable cortical irregularity of
the fifth metacarpal bone likely old healed fracture. There is no
evidence of fracture or dislocation. There is no evidence of severe
arthropathy or other focal bone abnormality. Soft tissues are
unremarkable.
IMPRESSION: Diffusely decreased bone density with no acute displaced fracture or
dislocation.

## 2022-05-03 IMAGING — CT CT MAXILLOFACIAL W/O CM
3 of 4 series · 14 of 47 positions shown, 16 images · non-contrast
Comparison: CT head 09/02/2017, CT neck 04/01/2014 images without
report

CLINICAL DATA: Facial trauma.

EXAM:
CT HEAD WITHOUT CONTRAST
CT MAXILLOFACIAL WITHOUT CONTRAST
CT CERVICAL SPINE WITHOUT CONTRAST
TECHNIQUE: Multidetector CT imaging of the head, cervical spine, and
maxillofacial structures were performed using the standard protocol
without intravenous contrast. Multiplanar CT image reconstructions
of the cervical spine and maxillofacial structures were also
generated.

[Series 3: facial/ orbits 2.0 mpr ax · axial · 0.34mm/px · z∈[-211,-69]mm · 8 of 93 slices shown, 10 images]
[im 11/93  brain]
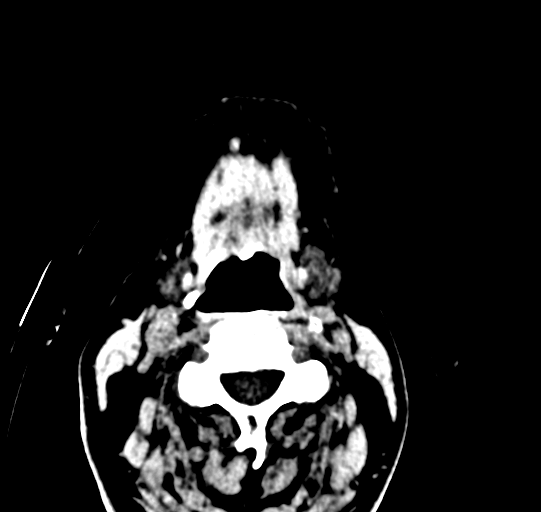
[im 11/93  bone]
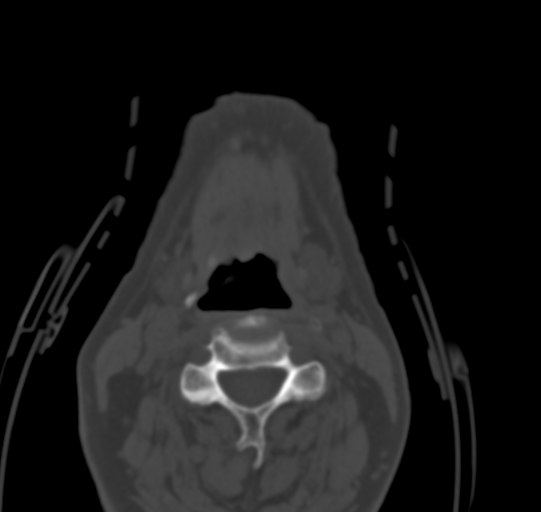
[im 21/93  bone]
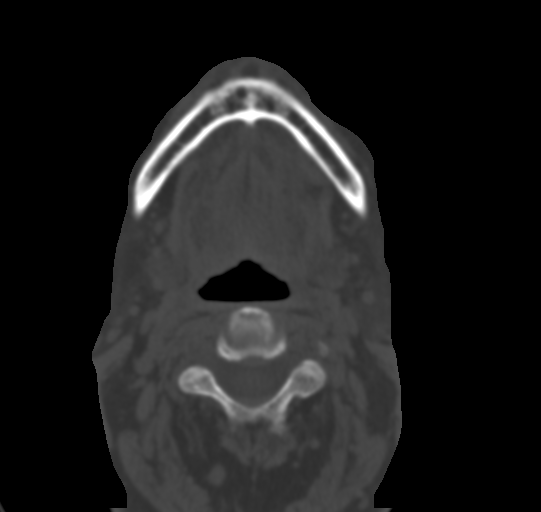
[im 31/93  bone]
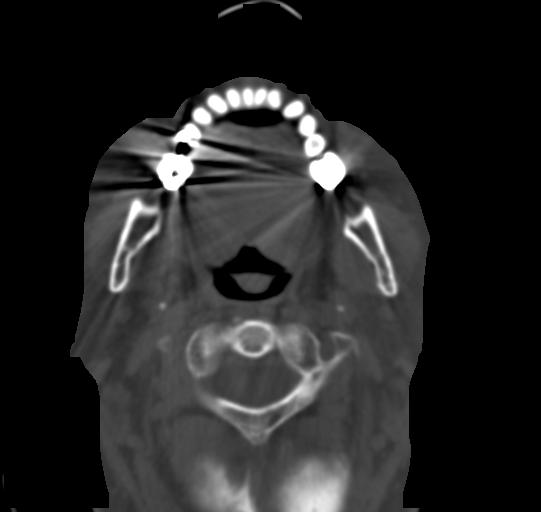
[im 41/93  bone]
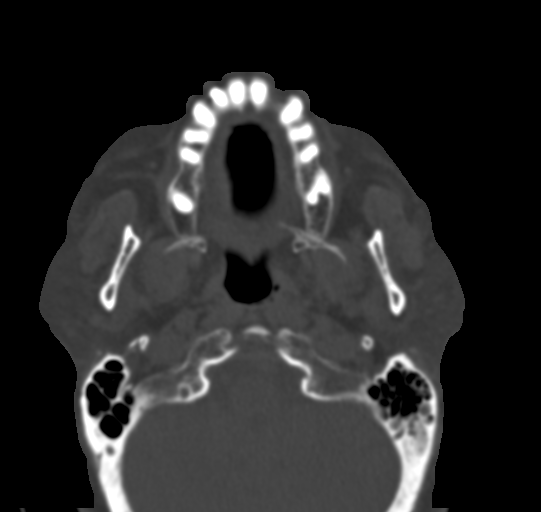
[im 52/93  brain]
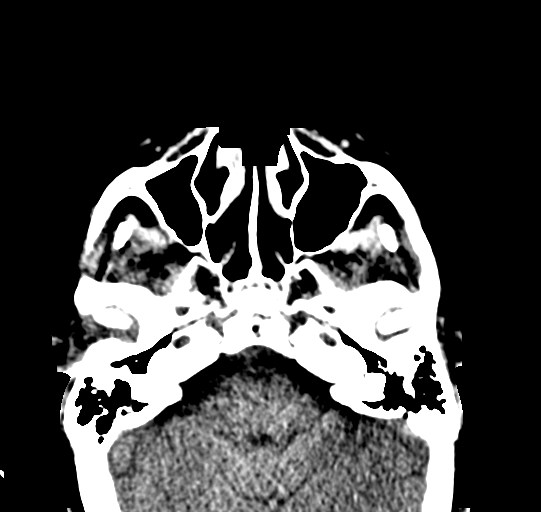
[im 52/93  bone]
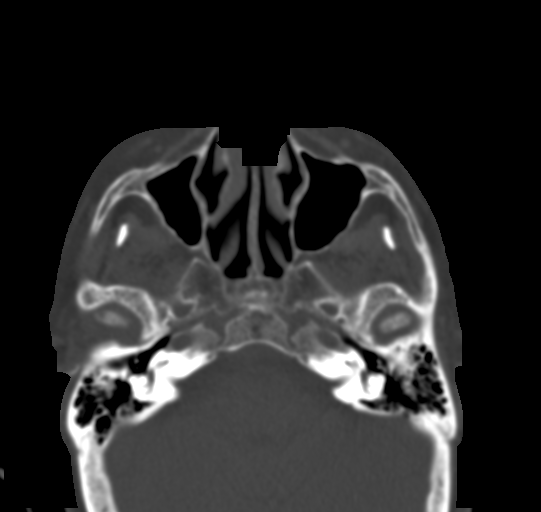
[im 62/93  bone]
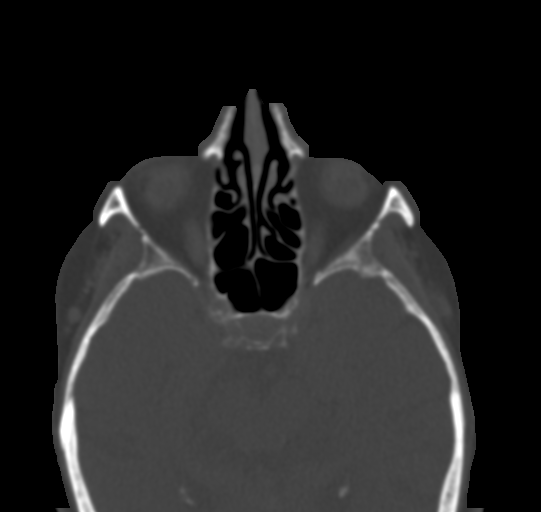
[im 72/93  bone]
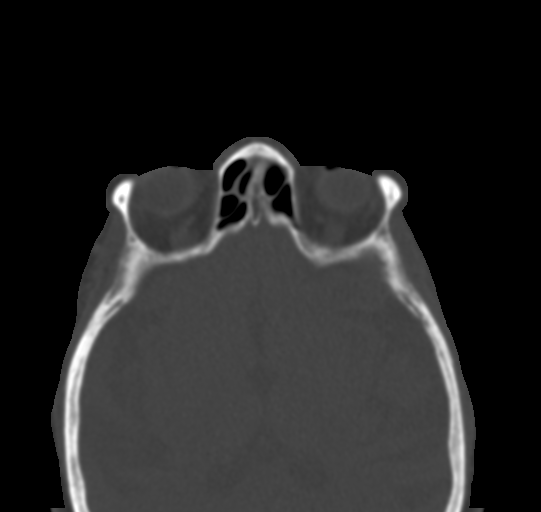
[im 82/93  bone]
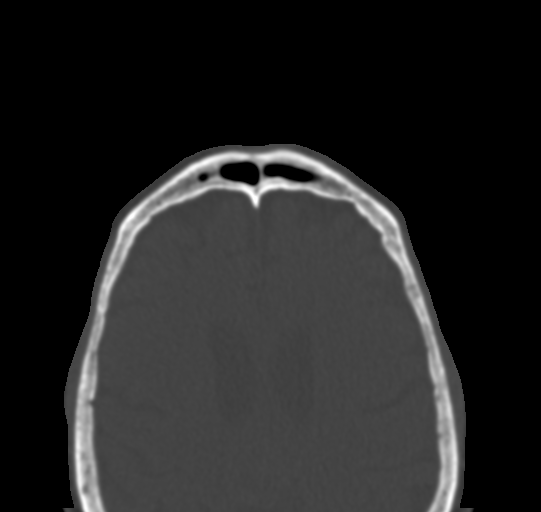

[Series 7: coronal soft tissue · coronal · 0.35mm/px · 3 of 78 slices shown]
[im 26/78  bone]
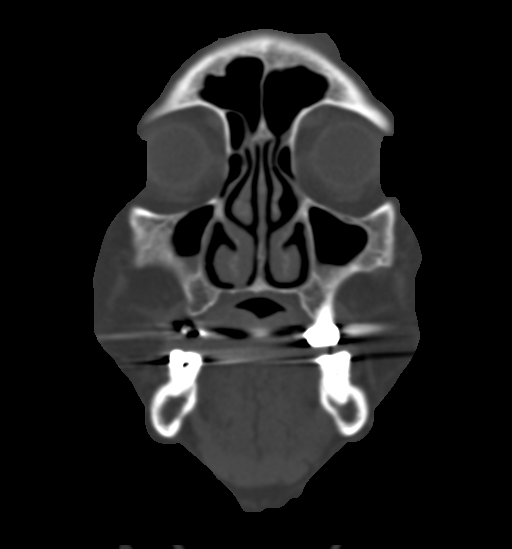
[im 35/78  bone]
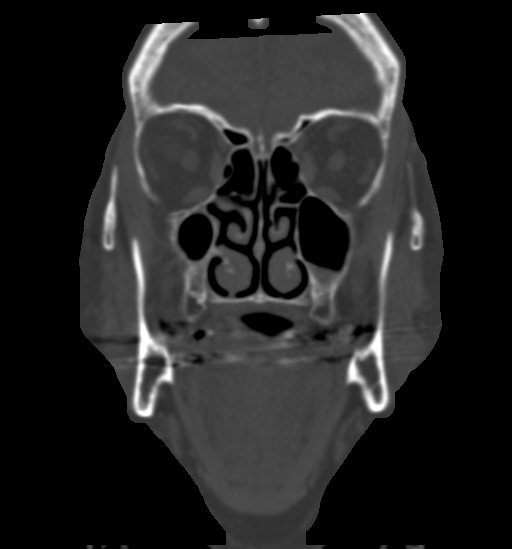
[im 43/78  bone]
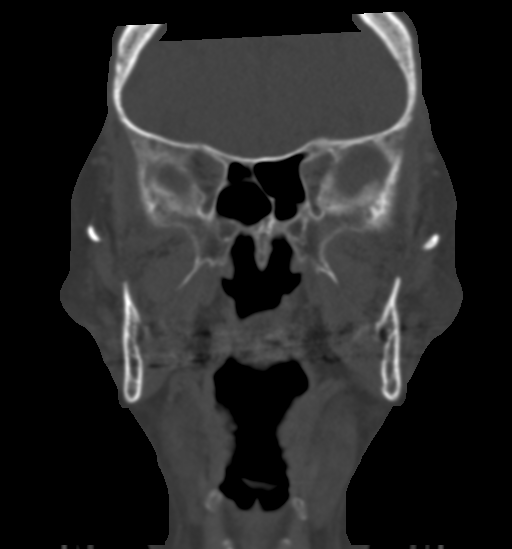

[Series 9: sagittal soft tissue · sagittal · 0.32mm/px · 3 of 81 slices shown]
[im 27/81  bone]
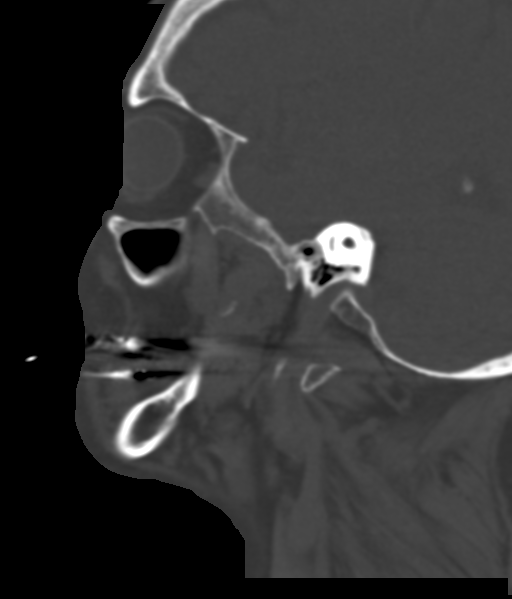
[im 41/81  bone]
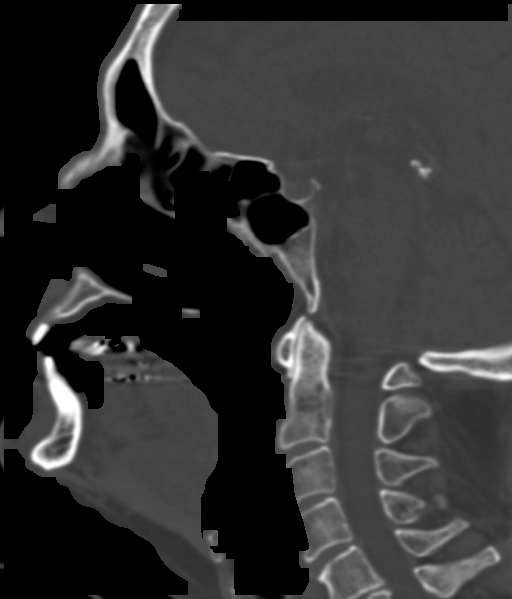
[im 54/81  bone]
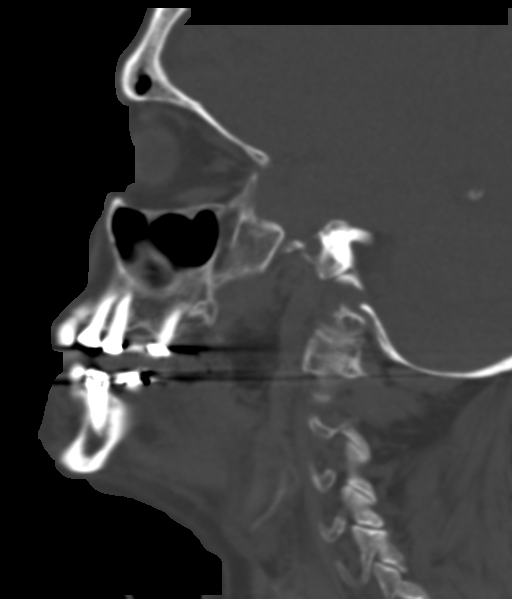

[14 of 47 positions shown; findings below may reference images not displayed]

FINDINGS: CT HEAD FINDINGS

Brain:

No evidence of large-territorial acute infarction. No parenchymal
hemorrhage. No mass lesion. No extra-axial collection.

No mass effect or midline shift. No hydrocephalus. Basilar cisterns
are patent.

Vascular: No hyperdense vessel. Atherosclerotic calcifications are
present within the cavernous internal carotid arteries.

Skull: No acute fracture or focal lesion.

Other: Left frontal scalp subcutaneus soft tissue edema and 8 mm
hematoma.

CT MAXILLOFACIAL FINDINGS

Osseous: No fracture or mandibular dislocation. No destructive
process.

Sinuses/Orbits: Bilateral mucosal thickening of the maxillary
sinuses. Paranasal sinuses and mastoid air cells are clear.
Bilateral lens replacement. Otherwise the orbits are unremarkable.

Soft tissues: Least mild atherosclerotic plaque of the carotid
arteries in the neck.

CT CERVICAL SPINE FINDINGS

Alignment: Normal.

Skull base and vertebrae: Multilevel mild-to-moderate degenerative
changes of the spine. No severe osseous neural foraminal or central
canal stenosis. No acute fracture. Redemonstration of a densely
sclerotic osseous lesion within the spinous process of the T1
vertebral body likely represents a bone island. No aggressive
appearing focal osseous lesion or focal pathologic process.

Soft tissues and spinal canal: No prevertebral fluid or swelling. No
visible canal hematoma.

Upper chest: Unremarkable.

Other: Diffusely decreased bone density.
IMPRESSION: 1. No acute intracranial abnormality.
2. No acute displaced facial fracture.
3. No acute displaced fracture or traumatic listhesis of the
cervical spine.
4. Diffusely decreased bone density.

## 2022-05-09 DIAGNOSIS — H6092 Unspecified otitis externa, left ear: Secondary | ICD-10-CM | POA: Diagnosis not present

## 2022-05-09 DIAGNOSIS — J329 Chronic sinusitis, unspecified: Secondary | ICD-10-CM | POA: Diagnosis not present

## 2022-05-09 DIAGNOSIS — Z6830 Body mass index (BMI) 30.0-30.9, adult: Secondary | ICD-10-CM | POA: Diagnosis not present

## 2022-05-09 DIAGNOSIS — L299 Pruritus, unspecified: Secondary | ICD-10-CM | POA: Diagnosis not present

## 2022-05-10 DIAGNOSIS — N200 Calculus of kidney: Secondary | ICD-10-CM | POA: Diagnosis not present

## 2022-05-10 DIAGNOSIS — R1032 Left lower quadrant pain: Secondary | ICD-10-CM | POA: Diagnosis not present

## 2022-05-10 DIAGNOSIS — N390 Urinary tract infection, site not specified: Secondary | ICD-10-CM | POA: Diagnosis not present

## 2022-05-11 DIAGNOSIS — Z466 Encounter for fitting and adjustment of urinary device: Secondary | ICD-10-CM | POA: Diagnosis not present

## 2022-05-11 DIAGNOSIS — N200 Calculus of kidney: Secondary | ICD-10-CM | POA: Diagnosis not present

## 2022-05-11 DIAGNOSIS — Z87442 Personal history of urinary calculi: Secondary | ICD-10-CM | POA: Diagnosis not present

## 2022-05-28 DIAGNOSIS — N200 Calculus of kidney: Secondary | ICD-10-CM | POA: Diagnosis not present

## 2022-05-28 DIAGNOSIS — R1032 Left lower quadrant pain: Secondary | ICD-10-CM | POA: Diagnosis not present

## 2022-05-28 DIAGNOSIS — N390 Urinary tract infection, site not specified: Secondary | ICD-10-CM | POA: Diagnosis not present

## 2022-06-05 ENCOUNTER — Other Ambulatory Visit: Payer: Medicare HMO

## 2022-06-06 ENCOUNTER — Telehealth: Payer: Medicare HMO | Admitting: Oncology

## 2022-06-07 ENCOUNTER — Telehealth: Payer: Medicare HMO | Admitting: Oncology

## 2022-06-19 ENCOUNTER — Other Ambulatory Visit: Payer: Self-pay | Admitting: Oncology

## 2022-06-19 ENCOUNTER — Inpatient Hospital Stay: Payer: Medicare HMO | Attending: Oncology

## 2022-06-19 DIAGNOSIS — Z148 Genetic carrier of other disease: Secondary | ICD-10-CM | POA: Diagnosis not present

## 2022-06-19 DIAGNOSIS — Z1589 Genetic susceptibility to other disease: Secondary | ICD-10-CM | POA: Diagnosis not present

## 2022-06-19 DIAGNOSIS — D7589 Other specified diseases of blood and blood-forming organs: Secondary | ICD-10-CM | POA: Diagnosis not present

## 2022-06-19 LAB — CMP (CANCER CENTER ONLY)
ALT: 16 U/L (ref 0–44)
AST: 19 U/L (ref 15–41)
Albumin: 4 g/dL (ref 3.5–5.0)
Alkaline Phosphatase: 46 U/L (ref 38–126)
Anion gap: 8 (ref 5–15)
BUN: 29 mg/dL — ABNORMAL HIGH (ref 8–23)
CO2: 24 mmol/L (ref 22–32)
Calcium: 9.4 mg/dL (ref 8.9–10.3)
Chloride: 108 mmol/L (ref 98–111)
Creatinine: 0.94 mg/dL (ref 0.44–1.00)
GFR, Estimated: >60 mL/min (ref >60)
Glucose, Bld: 102 mg/dL — ABNORMAL HIGH (ref 70–99)
Potassium: 4.3 mmol/L (ref 3.5–5.1)
Sodium: 140 mmol/L (ref 135–145)
Total Bilirubin: 0.8 mg/dL (ref 0.3–1.2)
Total Protein: 7.3 g/dL (ref 6.5–8.1)

## 2022-06-19 LAB — CBC WITH DIFFERENTIAL (CANCER CENTER ONLY)
Abs Immature Granulocytes: 0.01 10*3/uL (ref 0.00–0.07)
Basophils Absolute: 0.1 10*3/uL (ref 0.0–0.1)
Basophils Relative: 1 %
Eosinophils Absolute: 0.3 10*3/uL (ref 0.0–0.5)
Eosinophils Relative: 4 %
HCT: 38.5 % (ref 36.0–46.0)
Hemoglobin: 12 g/dL (ref 12.0–15.0)
Immature Granulocytes: 0 %
Lymphocytes Relative: 18 %
Lymphs Abs: 1.3 10*3/uL (ref 0.7–4.0)
MCH: 26.5 pg (ref 26.0–34.0)
MCHC: 31.2 g/dL (ref 30.0–36.0)
MCV: 85.2 fL (ref 80.0–100.0)
Monocytes Absolute: 0.5 10*3/uL (ref 0.1–1.0)
Monocytes Relative: 6 %
Neutro Abs: 5 10*3/uL (ref 1.7–7.7)
Neutrophils Relative %: 71 %
Platelet Count: 188 10*3/uL (ref 150–400)
RBC: 4.52 MIL/uL (ref 3.87–5.11)
RDW: 15.4 % (ref 11.5–15.5)
WBC Count: 7.1 10*3/uL (ref 4.0–10.5)
nRBC: 0 % (ref 0.0–0.2)

## 2022-06-19 LAB — IRON AND TIBC
Iron: 42 ug/dL (ref 28–170)
Saturation Ratios: 13 % (ref 10.4–31.8)
TIBC: 320 ug/dL (ref 250–450)
UIBC: 278 ug/dL

## 2022-06-19 LAB — FERRITIN: Ferritin: 161 ng/mL (ref 11–307)

## 2022-06-20 DIAGNOSIS — N200 Calculus of kidney: Secondary | ICD-10-CM | POA: Diagnosis not present

## 2022-06-20 DIAGNOSIS — R1032 Left lower quadrant pain: Secondary | ICD-10-CM | POA: Diagnosis not present

## 2022-06-20 DIAGNOSIS — N390 Urinary tract infection, site not specified: Secondary | ICD-10-CM | POA: Diagnosis not present

## 2022-06-20 NOTE — Progress Notes (Signed)
  [facility]  [address] [place],  [place]  [address] [phone]  Clinic Day:  06/21/2022  Referring physician: Marylen Ponto, MD  TELEPHONE VISIT  HISTORY OF PRESENT ILLNESS:  The patient is a 76 y.o. 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 As her iron parameters have not been particularly elevated, she has been followed conservatively.  Her phone visit today serves as her routine follow-up.  Since her last visit, the patient has been doing well.  She denies having any systemic symptoms, such as musculoskeletal discomfort, which concern her for complications related to her underlying hemochromatosis.  PHYSICAL EXAM: DEFERRED   LABS:      Latest Ref Rng & Units 06/19/2022    1:55 PM 06/05/2021   12:00 AM 03/14/2021    2:41 PM  CBC  WBC 4.0 - 10.5 K/uL 7.1  6.6     9.2   Hemoglobin 12.0 - 15.0 g/dL 66.5  99.3     57.0   Hematocrit 36.0 - 46.0 % 38.5  38     38.1   Platelets 150 - 400 K/uL 188  172     194      This result is from an external source.    Latest Reference Range & Units 06/19/22 13:55  Iron 28 - 170 ug/dL 42  UIBC ug/dL 177  TIBC 939 - 030 ug/dL 092  Saturation Ratios 10.4 - 31.8 % 13  Ferritin 11 - 307 ng/mL 161    ASSESSMENT & PLAN:  Assessment/Plan:  A 76 y.o. 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  When evaluating her labs, there is nothing per her iron studies which suggests she needs to be phlebotomized.  As she is only a carrier, I will stay away from phlebotomies unless her iron parameters rise precipitously over time.  Ultimately, she may never need to be phlebotomized again.    Clinically, she is doing well.  I will see this patient back in 1 year for repeat clinical assessment. The patient understands all the plans discussed today and is in agreement with them.    Cecilia Nishikawa Kirby Funk, MD

## 2022-06-21 ENCOUNTER — Other Ambulatory Visit: Payer: Self-pay | Admitting: Oncology

## 2022-06-21 ENCOUNTER — Inpatient Hospital Stay (INDEPENDENT_AMBULATORY_CARE_PROVIDER_SITE_OTHER): Payer: Medicare HMO | Admitting: Oncology

## 2022-06-22 ENCOUNTER — Telehealth: Payer: Self-pay | Admitting: Oncology

## 2022-06-22 NOTE — Telephone Encounter (Signed)
Patient has been scheduled for next appt per 06/21/22 LOS. Aware of appt dates and times.

## 2022-07-02 ENCOUNTER — Encounter: Payer: Self-pay | Admitting: Oncology

## 2022-07-05 DIAGNOSIS — Z6830 Body mass index (BMI) 30.0-30.9, adult: Secondary | ICD-10-CM | POA: Diagnosis not present

## 2022-07-05 DIAGNOSIS — Z23 Encounter for immunization: Secondary | ICD-10-CM | POA: Diagnosis not present

## 2022-07-05 DIAGNOSIS — Z1331 Encounter for screening for depression: Secondary | ICD-10-CM | POA: Diagnosis not present

## 2022-07-05 DIAGNOSIS — K219 Gastro-esophageal reflux disease without esophagitis: Secondary | ICD-10-CM | POA: Diagnosis not present

## 2022-07-05 DIAGNOSIS — Z79899 Other long term (current) drug therapy: Secondary | ICD-10-CM | POA: Diagnosis not present

## 2022-07-05 DIAGNOSIS — E785 Hyperlipidemia, unspecified: Secondary | ICD-10-CM | POA: Diagnosis not present

## 2022-07-05 DIAGNOSIS — M810 Age-related osteoporosis without current pathological fracture: Secondary | ICD-10-CM | POA: Diagnosis not present

## 2022-07-05 DIAGNOSIS — Z2821 Immunization not carried out because of patient refusal: Secondary | ICD-10-CM | POA: Diagnosis not present

## 2022-07-05 DIAGNOSIS — Z0000 Encounter for general adult medical examination without abnormal findings: Secondary | ICD-10-CM | POA: Diagnosis not present

## 2022-07-23 DIAGNOSIS — J329 Chronic sinusitis, unspecified: Secondary | ICD-10-CM | POA: Diagnosis not present

## 2022-07-23 DIAGNOSIS — J40 Bronchitis, not specified as acute or chronic: Secondary | ICD-10-CM | POA: Diagnosis not present

## 2022-08-14 DIAGNOSIS — Z1231 Encounter for screening mammogram for malignant neoplasm of breast: Secondary | ICD-10-CM | POA: Diagnosis not present

## 2022-09-24 DIAGNOSIS — J329 Chronic sinusitis, unspecified: Secondary | ICD-10-CM | POA: Diagnosis not present

## 2022-09-24 DIAGNOSIS — J40 Bronchitis, not specified as acute or chronic: Secondary | ICD-10-CM | POA: Diagnosis not present

## 2022-09-26 DIAGNOSIS — I951 Orthostatic hypotension: Secondary | ICD-10-CM | POA: Diagnosis not present

## 2022-09-26 DIAGNOSIS — I495 Sick sinus syndrome: Secondary | ICD-10-CM | POA: Diagnosis not present

## 2022-09-26 DIAGNOSIS — I4510 Unspecified right bundle-branch block: Secondary | ICD-10-CM | POA: Diagnosis not present

## 2022-09-26 DIAGNOSIS — I480 Paroxysmal atrial fibrillation: Secondary | ICD-10-CM | POA: Diagnosis not present

## 2022-10-09 DIAGNOSIS — Z6830 Body mass index (BMI) 30.0-30.9, adult: Secondary | ICD-10-CM | POA: Diagnosis not present

## 2022-10-09 DIAGNOSIS — M62838 Other muscle spasm: Secondary | ICD-10-CM | POA: Diagnosis not present

## 2022-10-12 DIAGNOSIS — R1032 Left lower quadrant pain: Secondary | ICD-10-CM | POA: Diagnosis not present

## 2022-10-12 DIAGNOSIS — N390 Urinary tract infection, site not specified: Secondary | ICD-10-CM | POA: Diagnosis not present

## 2022-10-12 DIAGNOSIS — N200 Calculus of kidney: Secondary | ICD-10-CM | POA: Diagnosis not present

## 2022-11-02 DIAGNOSIS — Z1339 Encounter for screening examination for other mental health and behavioral disorders: Secondary | ICD-10-CM | POA: Diagnosis not present

## 2022-11-02 DIAGNOSIS — Z6831 Body mass index (BMI) 31.0-31.9, adult: Secondary | ICD-10-CM | POA: Diagnosis not present

## 2022-11-02 DIAGNOSIS — M6530 Trigger finger, unspecified finger: Secondary | ICD-10-CM | POA: Diagnosis not present

## 2022-11-02 DIAGNOSIS — M2550 Pain in unspecified joint: Secondary | ICD-10-CM | POA: Diagnosis not present

## 2022-11-02 DIAGNOSIS — Z1331 Encounter for screening for depression: Secondary | ICD-10-CM | POA: Diagnosis not present

## 2022-11-07 DIAGNOSIS — M545 Low back pain, unspecified: Secondary | ICD-10-CM | POA: Diagnosis not present

## 2022-11-07 DIAGNOSIS — M546 Pain in thoracic spine: Secondary | ICD-10-CM | POA: Diagnosis not present

## 2022-11-07 DIAGNOSIS — R1111 Vomiting without nausea: Secondary | ICD-10-CM | POA: Diagnosis not present

## 2022-11-07 DIAGNOSIS — M533 Sacrococcygeal disorders, not elsewhere classified: Secondary | ICD-10-CM | POA: Diagnosis not present

## 2022-11-07 DIAGNOSIS — R55 Syncope and collapse: Secondary | ICD-10-CM | POA: Diagnosis not present

## 2022-11-07 DIAGNOSIS — Z7982 Long term (current) use of aspirin: Secondary | ICD-10-CM | POA: Diagnosis not present

## 2022-11-07 DIAGNOSIS — K219 Gastro-esophageal reflux disease without esophagitis: Secondary | ICD-10-CM | POA: Diagnosis not present

## 2022-11-07 DIAGNOSIS — Z79899 Other long term (current) drug therapy: Secondary | ICD-10-CM | POA: Diagnosis not present

## 2022-11-07 DIAGNOSIS — R519 Headache, unspecified: Secondary | ICD-10-CM | POA: Diagnosis not present

## 2022-11-07 DIAGNOSIS — G4489 Other headache syndrome: Secondary | ICD-10-CM | POA: Diagnosis not present

## 2022-11-07 DIAGNOSIS — E041 Nontoxic single thyroid nodule: Secondary | ICD-10-CM | POA: Diagnosis not present

## 2022-11-07 DIAGNOSIS — R42 Dizziness and giddiness: Secondary | ICD-10-CM | POA: Diagnosis not present

## 2022-11-07 DIAGNOSIS — S0003XA Contusion of scalp, initial encounter: Secondary | ICD-10-CM | POA: Diagnosis not present

## 2022-11-07 DIAGNOSIS — R2681 Unsteadiness on feet: Secondary | ICD-10-CM | POA: Diagnosis not present

## 2022-11-07 DIAGNOSIS — W19XXXA Unspecified fall, initial encounter: Secondary | ICD-10-CM | POA: Diagnosis not present

## 2022-11-07 DIAGNOSIS — I5032 Chronic diastolic (congestive) heart failure: Secondary | ICD-10-CM | POA: Diagnosis not present

## 2022-11-08 DIAGNOSIS — I361 Nonrheumatic tricuspid (valve) insufficiency: Secondary | ICD-10-CM | POA: Diagnosis not present

## 2022-11-08 DIAGNOSIS — R6884 Jaw pain: Secondary | ICD-10-CM | POA: Diagnosis not present

## 2022-11-08 DIAGNOSIS — I272 Pulmonary hypertension, unspecified: Secondary | ICD-10-CM | POA: Diagnosis not present

## 2022-11-12 DIAGNOSIS — D649 Anemia, unspecified: Secondary | ICD-10-CM | POA: Diagnosis not present

## 2022-11-12 DIAGNOSIS — I272 Pulmonary hypertension, unspecified: Secondary | ICD-10-CM | POA: Diagnosis not present

## 2022-11-12 DIAGNOSIS — F32A Depression, unspecified: Secondary | ICD-10-CM | POA: Diagnosis not present

## 2022-11-12 DIAGNOSIS — I5032 Chronic diastolic (congestive) heart failure: Secondary | ICD-10-CM | POA: Diagnosis not present

## 2022-11-12 DIAGNOSIS — I480 Paroxysmal atrial fibrillation: Secondary | ICD-10-CM | POA: Diagnosis not present

## 2022-11-12 DIAGNOSIS — E041 Nontoxic single thyroid nodule: Secondary | ICD-10-CM | POA: Diagnosis not present

## 2022-11-12 DIAGNOSIS — S0003XD Contusion of scalp, subsequent encounter: Secondary | ICD-10-CM | POA: Diagnosis not present

## 2022-11-12 DIAGNOSIS — I739 Peripheral vascular disease, unspecified: Secondary | ICD-10-CM | POA: Diagnosis not present

## 2022-11-12 DIAGNOSIS — I951 Orthostatic hypotension: Secondary | ICD-10-CM | POA: Diagnosis not present

## 2022-11-13 DIAGNOSIS — I272 Pulmonary hypertension, unspecified: Secondary | ICD-10-CM | POA: Diagnosis not present

## 2022-11-13 DIAGNOSIS — Z6831 Body mass index (BMI) 31.0-31.9, adult: Secondary | ICD-10-CM | POA: Diagnosis not present

## 2022-11-13 DIAGNOSIS — I951 Orthostatic hypotension: Secondary | ICD-10-CM | POA: Diagnosis not present

## 2022-11-13 DIAGNOSIS — I480 Paroxysmal atrial fibrillation: Secondary | ICD-10-CM | POA: Diagnosis not present

## 2022-11-13 DIAGNOSIS — S0003XD Contusion of scalp, subsequent encounter: Secondary | ICD-10-CM | POA: Diagnosis not present

## 2022-11-13 DIAGNOSIS — D649 Anemia, unspecified: Secondary | ICD-10-CM | POA: Diagnosis not present

## 2022-11-13 DIAGNOSIS — F32A Depression, unspecified: Secondary | ICD-10-CM | POA: Diagnosis not present

## 2022-11-13 DIAGNOSIS — I5032 Chronic diastolic (congestive) heart failure: Secondary | ICD-10-CM | POA: Diagnosis not present

## 2022-11-13 DIAGNOSIS — S2239XA Fracture of one rib, unspecified side, initial encounter for closed fracture: Secondary | ICD-10-CM | POA: Diagnosis not present

## 2022-11-13 DIAGNOSIS — I739 Peripheral vascular disease, unspecified: Secondary | ICD-10-CM | POA: Diagnosis not present

## 2022-11-13 DIAGNOSIS — E041 Nontoxic single thyroid nodule: Secondary | ICD-10-CM | POA: Diagnosis not present

## 2022-11-15 DIAGNOSIS — S0003XD Contusion of scalp, subsequent encounter: Secondary | ICD-10-CM | POA: Diagnosis not present

## 2022-11-15 DIAGNOSIS — D649 Anemia, unspecified: Secondary | ICD-10-CM | POA: Diagnosis not present

## 2022-11-15 DIAGNOSIS — I480 Paroxysmal atrial fibrillation: Secondary | ICD-10-CM | POA: Diagnosis not present

## 2022-11-15 DIAGNOSIS — I272 Pulmonary hypertension, unspecified: Secondary | ICD-10-CM | POA: Diagnosis not present

## 2022-11-15 DIAGNOSIS — I5032 Chronic diastolic (congestive) heart failure: Secondary | ICD-10-CM | POA: Diagnosis not present

## 2022-11-15 DIAGNOSIS — I951 Orthostatic hypotension: Secondary | ICD-10-CM | POA: Diagnosis not present

## 2022-11-15 DIAGNOSIS — I739 Peripheral vascular disease, unspecified: Secondary | ICD-10-CM | POA: Diagnosis not present

## 2022-11-15 DIAGNOSIS — F32A Depression, unspecified: Secondary | ICD-10-CM | POA: Diagnosis not present

## 2022-11-15 DIAGNOSIS — E041 Nontoxic single thyroid nodule: Secondary | ICD-10-CM | POA: Diagnosis not present

## 2022-11-16 DIAGNOSIS — F32A Depression, unspecified: Secondary | ICD-10-CM | POA: Diagnosis not present

## 2022-11-16 DIAGNOSIS — I739 Peripheral vascular disease, unspecified: Secondary | ICD-10-CM | POA: Diagnosis not present

## 2022-11-16 DIAGNOSIS — D649 Anemia, unspecified: Secondary | ICD-10-CM | POA: Diagnosis not present

## 2022-11-16 DIAGNOSIS — I951 Orthostatic hypotension: Secondary | ICD-10-CM | POA: Diagnosis not present

## 2022-11-16 DIAGNOSIS — E041 Nontoxic single thyroid nodule: Secondary | ICD-10-CM | POA: Diagnosis not present

## 2022-11-16 DIAGNOSIS — I480 Paroxysmal atrial fibrillation: Secondary | ICD-10-CM | POA: Diagnosis not present

## 2022-11-16 DIAGNOSIS — I5032 Chronic diastolic (congestive) heart failure: Secondary | ICD-10-CM | POA: Diagnosis not present

## 2022-11-16 DIAGNOSIS — I272 Pulmonary hypertension, unspecified: Secondary | ICD-10-CM | POA: Diagnosis not present

## 2022-11-16 DIAGNOSIS — S0003XD Contusion of scalp, subsequent encounter: Secondary | ICD-10-CM | POA: Diagnosis not present

## 2022-11-19 DIAGNOSIS — I739 Peripheral vascular disease, unspecified: Secondary | ICD-10-CM | POA: Diagnosis not present

## 2022-11-19 DIAGNOSIS — S0003XD Contusion of scalp, subsequent encounter: Secondary | ICD-10-CM | POA: Diagnosis not present

## 2022-11-19 DIAGNOSIS — I480 Paroxysmal atrial fibrillation: Secondary | ICD-10-CM | POA: Diagnosis not present

## 2022-11-19 DIAGNOSIS — D649 Anemia, unspecified: Secondary | ICD-10-CM | POA: Diagnosis not present

## 2022-11-19 DIAGNOSIS — I951 Orthostatic hypotension: Secondary | ICD-10-CM | POA: Diagnosis not present

## 2022-11-19 DIAGNOSIS — F32A Depression, unspecified: Secondary | ICD-10-CM | POA: Diagnosis not present

## 2022-11-19 DIAGNOSIS — E041 Nontoxic single thyroid nodule: Secondary | ICD-10-CM | POA: Diagnosis not present

## 2022-11-19 DIAGNOSIS — I272 Pulmonary hypertension, unspecified: Secondary | ICD-10-CM | POA: Diagnosis not present

## 2022-11-19 DIAGNOSIS — I5032 Chronic diastolic (congestive) heart failure: Secondary | ICD-10-CM | POA: Diagnosis not present

## 2022-11-20 DIAGNOSIS — E041 Nontoxic single thyroid nodule: Secondary | ICD-10-CM | POA: Diagnosis not present

## 2022-11-21 DIAGNOSIS — D649 Anemia, unspecified: Secondary | ICD-10-CM | POA: Diagnosis not present

## 2022-11-21 DIAGNOSIS — F32A Depression, unspecified: Secondary | ICD-10-CM | POA: Diagnosis not present

## 2022-11-21 DIAGNOSIS — E041 Nontoxic single thyroid nodule: Secondary | ICD-10-CM | POA: Diagnosis not present

## 2022-11-21 DIAGNOSIS — I480 Paroxysmal atrial fibrillation: Secondary | ICD-10-CM | POA: Diagnosis not present

## 2022-11-21 DIAGNOSIS — I951 Orthostatic hypotension: Secondary | ICD-10-CM | POA: Diagnosis not present

## 2022-11-21 DIAGNOSIS — I5032 Chronic diastolic (congestive) heart failure: Secondary | ICD-10-CM | POA: Diagnosis not present

## 2022-11-21 DIAGNOSIS — S0003XD Contusion of scalp, subsequent encounter: Secondary | ICD-10-CM | POA: Diagnosis not present

## 2022-11-21 DIAGNOSIS — I739 Peripheral vascular disease, unspecified: Secondary | ICD-10-CM | POA: Diagnosis not present

## 2022-11-21 DIAGNOSIS — I272 Pulmonary hypertension, unspecified: Secondary | ICD-10-CM | POA: Diagnosis not present

## 2022-11-22 DIAGNOSIS — I739 Peripheral vascular disease, unspecified: Secondary | ICD-10-CM | POA: Diagnosis not present

## 2022-11-22 DIAGNOSIS — I5032 Chronic diastolic (congestive) heart failure: Secondary | ICD-10-CM | POA: Diagnosis not present

## 2022-11-22 DIAGNOSIS — D649 Anemia, unspecified: Secondary | ICD-10-CM | POA: Diagnosis not present

## 2022-11-22 DIAGNOSIS — S0003XD Contusion of scalp, subsequent encounter: Secondary | ICD-10-CM | POA: Diagnosis not present

## 2022-11-22 DIAGNOSIS — I480 Paroxysmal atrial fibrillation: Secondary | ICD-10-CM | POA: Diagnosis not present

## 2022-11-22 DIAGNOSIS — F32A Depression, unspecified: Secondary | ICD-10-CM | POA: Diagnosis not present

## 2022-11-22 DIAGNOSIS — I951 Orthostatic hypotension: Secondary | ICD-10-CM | POA: Diagnosis not present

## 2022-11-22 DIAGNOSIS — E041 Nontoxic single thyroid nodule: Secondary | ICD-10-CM | POA: Diagnosis not present

## 2022-11-22 DIAGNOSIS — I272 Pulmonary hypertension, unspecified: Secondary | ICD-10-CM | POA: Diagnosis not present

## 2022-11-23 DIAGNOSIS — F32A Depression, unspecified: Secondary | ICD-10-CM | POA: Diagnosis not present

## 2022-11-23 DIAGNOSIS — I5032 Chronic diastolic (congestive) heart failure: Secondary | ICD-10-CM | POA: Diagnosis not present

## 2022-11-23 DIAGNOSIS — I480 Paroxysmal atrial fibrillation: Secondary | ICD-10-CM | POA: Diagnosis not present

## 2022-11-23 DIAGNOSIS — I951 Orthostatic hypotension: Secondary | ICD-10-CM | POA: Diagnosis not present

## 2022-11-23 DIAGNOSIS — E041 Nontoxic single thyroid nodule: Secondary | ICD-10-CM | POA: Diagnosis not present

## 2022-11-23 DIAGNOSIS — I272 Pulmonary hypertension, unspecified: Secondary | ICD-10-CM | POA: Diagnosis not present

## 2022-11-23 DIAGNOSIS — D649 Anemia, unspecified: Secondary | ICD-10-CM | POA: Diagnosis not present

## 2022-11-23 DIAGNOSIS — I739 Peripheral vascular disease, unspecified: Secondary | ICD-10-CM | POA: Diagnosis not present

## 2022-11-23 DIAGNOSIS — S0003XD Contusion of scalp, subsequent encounter: Secondary | ICD-10-CM | POA: Diagnosis not present

## 2022-11-28 DIAGNOSIS — I5032 Chronic diastolic (congestive) heart failure: Secondary | ICD-10-CM | POA: Diagnosis not present

## 2022-11-28 DIAGNOSIS — I272 Pulmonary hypertension, unspecified: Secondary | ICD-10-CM | POA: Diagnosis not present

## 2022-11-28 DIAGNOSIS — I739 Peripheral vascular disease, unspecified: Secondary | ICD-10-CM | POA: Diagnosis not present

## 2022-11-28 DIAGNOSIS — I951 Orthostatic hypotension: Secondary | ICD-10-CM | POA: Diagnosis not present

## 2022-11-28 DIAGNOSIS — E041 Nontoxic single thyroid nodule: Secondary | ICD-10-CM | POA: Diagnosis not present

## 2022-11-28 DIAGNOSIS — I480 Paroxysmal atrial fibrillation: Secondary | ICD-10-CM | POA: Diagnosis not present

## 2022-11-28 DIAGNOSIS — S0003XD Contusion of scalp, subsequent encounter: Secondary | ICD-10-CM | POA: Diagnosis not present

## 2022-11-28 DIAGNOSIS — D649 Anemia, unspecified: Secondary | ICD-10-CM | POA: Diagnosis not present

## 2022-11-28 DIAGNOSIS — F32A Depression, unspecified: Secondary | ICD-10-CM | POA: Diagnosis not present

## 2022-11-29 DIAGNOSIS — I480 Paroxysmal atrial fibrillation: Secondary | ICD-10-CM | POA: Diagnosis not present

## 2022-11-29 DIAGNOSIS — I739 Peripheral vascular disease, unspecified: Secondary | ICD-10-CM | POA: Diagnosis not present

## 2022-11-29 DIAGNOSIS — F32A Depression, unspecified: Secondary | ICD-10-CM | POA: Diagnosis not present

## 2022-11-29 DIAGNOSIS — E041 Nontoxic single thyroid nodule: Secondary | ICD-10-CM | POA: Diagnosis not present

## 2022-11-29 DIAGNOSIS — S0003XD Contusion of scalp, subsequent encounter: Secondary | ICD-10-CM | POA: Diagnosis not present

## 2022-11-29 DIAGNOSIS — I272 Pulmonary hypertension, unspecified: Secondary | ICD-10-CM | POA: Diagnosis not present

## 2022-11-29 DIAGNOSIS — I5032 Chronic diastolic (congestive) heart failure: Secondary | ICD-10-CM | POA: Diagnosis not present

## 2022-11-29 DIAGNOSIS — I951 Orthostatic hypotension: Secondary | ICD-10-CM | POA: Diagnosis not present

## 2022-11-29 DIAGNOSIS — D649 Anemia, unspecified: Secondary | ICD-10-CM | POA: Diagnosis not present

## 2022-11-30 DIAGNOSIS — S0003XD Contusion of scalp, subsequent encounter: Secondary | ICD-10-CM | POA: Diagnosis not present

## 2022-11-30 DIAGNOSIS — E041 Nontoxic single thyroid nodule: Secondary | ICD-10-CM | POA: Diagnosis not present

## 2022-11-30 DIAGNOSIS — D649 Anemia, unspecified: Secondary | ICD-10-CM | POA: Diagnosis not present

## 2022-11-30 DIAGNOSIS — I951 Orthostatic hypotension: Secondary | ICD-10-CM | POA: Diagnosis not present

## 2022-11-30 DIAGNOSIS — I739 Peripheral vascular disease, unspecified: Secondary | ICD-10-CM | POA: Diagnosis not present

## 2022-11-30 DIAGNOSIS — I5032 Chronic diastolic (congestive) heart failure: Secondary | ICD-10-CM | POA: Diagnosis not present

## 2022-11-30 DIAGNOSIS — I272 Pulmonary hypertension, unspecified: Secondary | ICD-10-CM | POA: Diagnosis not present

## 2022-11-30 DIAGNOSIS — I480 Paroxysmal atrial fibrillation: Secondary | ICD-10-CM | POA: Diagnosis not present

## 2022-11-30 DIAGNOSIS — F32A Depression, unspecified: Secondary | ICD-10-CM | POA: Diagnosis not present

## 2022-12-03 DIAGNOSIS — C73 Malignant neoplasm of thyroid gland: Secondary | ICD-10-CM | POA: Diagnosis not present

## 2022-12-03 DIAGNOSIS — E041 Nontoxic single thyroid nodule: Secondary | ICD-10-CM | POA: Diagnosis not present

## 2022-12-05 DIAGNOSIS — I951 Orthostatic hypotension: Secondary | ICD-10-CM | POA: Diagnosis not present

## 2022-12-05 DIAGNOSIS — I480 Paroxysmal atrial fibrillation: Secondary | ICD-10-CM | POA: Diagnosis not present

## 2022-12-05 DIAGNOSIS — I739 Peripheral vascular disease, unspecified: Secondary | ICD-10-CM | POA: Diagnosis not present

## 2022-12-05 DIAGNOSIS — D649 Anemia, unspecified: Secondary | ICD-10-CM | POA: Diagnosis not present

## 2022-12-05 DIAGNOSIS — I5032 Chronic diastolic (congestive) heart failure: Secondary | ICD-10-CM | POA: Diagnosis not present

## 2022-12-05 DIAGNOSIS — S0003XD Contusion of scalp, subsequent encounter: Secondary | ICD-10-CM | POA: Diagnosis not present

## 2022-12-05 DIAGNOSIS — I272 Pulmonary hypertension, unspecified: Secondary | ICD-10-CM | POA: Diagnosis not present

## 2022-12-05 DIAGNOSIS — F32A Depression, unspecified: Secondary | ICD-10-CM | POA: Diagnosis not present

## 2022-12-05 DIAGNOSIS — E041 Nontoxic single thyroid nodule: Secondary | ICD-10-CM | POA: Diagnosis not present

## 2022-12-10 DIAGNOSIS — I951 Orthostatic hypotension: Secondary | ICD-10-CM | POA: Diagnosis not present

## 2022-12-10 DIAGNOSIS — I480 Paroxysmal atrial fibrillation: Secondary | ICD-10-CM | POA: Diagnosis not present

## 2022-12-10 DIAGNOSIS — S0003XD Contusion of scalp, subsequent encounter: Secondary | ICD-10-CM | POA: Diagnosis not present

## 2022-12-10 DIAGNOSIS — F32A Depression, unspecified: Secondary | ICD-10-CM | POA: Diagnosis not present

## 2022-12-10 DIAGNOSIS — D649 Anemia, unspecified: Secondary | ICD-10-CM | POA: Diagnosis not present

## 2022-12-10 DIAGNOSIS — I272 Pulmonary hypertension, unspecified: Secondary | ICD-10-CM | POA: Diagnosis not present

## 2022-12-10 DIAGNOSIS — I5032 Chronic diastolic (congestive) heart failure: Secondary | ICD-10-CM | POA: Diagnosis not present

## 2022-12-10 DIAGNOSIS — E041 Nontoxic single thyroid nodule: Secondary | ICD-10-CM | POA: Diagnosis not present

## 2022-12-10 DIAGNOSIS — I739 Peripheral vascular disease, unspecified: Secondary | ICD-10-CM | POA: Diagnosis not present

## 2022-12-12 DIAGNOSIS — I739 Peripheral vascular disease, unspecified: Secondary | ICD-10-CM | POA: Diagnosis not present

## 2022-12-12 DIAGNOSIS — I5032 Chronic diastolic (congestive) heart failure: Secondary | ICD-10-CM | POA: Diagnosis not present

## 2022-12-12 DIAGNOSIS — I272 Pulmonary hypertension, unspecified: Secondary | ICD-10-CM | POA: Diagnosis not present

## 2022-12-12 DIAGNOSIS — F32A Depression, unspecified: Secondary | ICD-10-CM | POA: Diagnosis not present

## 2022-12-12 DIAGNOSIS — I480 Paroxysmal atrial fibrillation: Secondary | ICD-10-CM | POA: Diagnosis not present

## 2022-12-12 DIAGNOSIS — E041 Nontoxic single thyroid nodule: Secondary | ICD-10-CM | POA: Diagnosis not present

## 2022-12-12 DIAGNOSIS — S0003XD Contusion of scalp, subsequent encounter: Secondary | ICD-10-CM | POA: Diagnosis not present

## 2022-12-12 DIAGNOSIS — I951 Orthostatic hypotension: Secondary | ICD-10-CM | POA: Diagnosis not present

## 2022-12-12 DIAGNOSIS — D649 Anemia, unspecified: Secondary | ICD-10-CM | POA: Diagnosis not present

## 2022-12-14 DIAGNOSIS — I739 Peripheral vascular disease, unspecified: Secondary | ICD-10-CM | POA: Diagnosis not present

## 2022-12-14 DIAGNOSIS — I272 Pulmonary hypertension, unspecified: Secondary | ICD-10-CM | POA: Diagnosis not present

## 2022-12-14 DIAGNOSIS — S0003XD Contusion of scalp, subsequent encounter: Secondary | ICD-10-CM | POA: Diagnosis not present

## 2022-12-14 DIAGNOSIS — D649 Anemia, unspecified: Secondary | ICD-10-CM | POA: Diagnosis not present

## 2022-12-14 DIAGNOSIS — F32A Depression, unspecified: Secondary | ICD-10-CM | POA: Diagnosis not present

## 2022-12-14 DIAGNOSIS — I480 Paroxysmal atrial fibrillation: Secondary | ICD-10-CM | POA: Diagnosis not present

## 2022-12-14 DIAGNOSIS — I5032 Chronic diastolic (congestive) heart failure: Secondary | ICD-10-CM | POA: Diagnosis not present

## 2022-12-14 DIAGNOSIS — E041 Nontoxic single thyroid nodule: Secondary | ICD-10-CM | POA: Diagnosis not present

## 2022-12-14 DIAGNOSIS — I951 Orthostatic hypotension: Secondary | ICD-10-CM | POA: Diagnosis not present

## 2022-12-18 ENCOUNTER — Inpatient Hospital Stay: Payer: Medicare HMO | Attending: Oncology | Admitting: Oncology

## 2022-12-18 ENCOUNTER — Other Ambulatory Visit: Payer: Self-pay | Admitting: Oncology

## 2022-12-18 ENCOUNTER — Inpatient Hospital Stay: Payer: Medicare HMO

## 2022-12-18 VITALS — BP 135/63 | HR 53 | Temp 98.5°F | Resp 16 | Ht 66.0 in | Wt 192.8 lb

## 2022-12-18 DIAGNOSIS — R490 Dysphonia: Secondary | ICD-10-CM | POA: Diagnosis not present

## 2022-12-18 DIAGNOSIS — C73 Malignant neoplasm of thyroid gland: Secondary | ICD-10-CM

## 2022-12-18 DIAGNOSIS — R1310 Dysphagia, unspecified: Secondary | ICD-10-CM | POA: Insufficient documentation

## 2022-12-18 LAB — CMP (CANCER CENTER ONLY)
ALT: 13 U/L (ref 0–44)
AST: 15 U/L (ref 15–41)
Albumin: 4.1 g/dL (ref 3.5–5.0)
Alkaline Phosphatase: 60 U/L (ref 38–126)
Anion gap: 7 (ref 5–15)
BUN: 27 mg/dL — ABNORMAL HIGH (ref 8–23)
CO2: 27 mmol/L (ref 22–32)
Calcium: 9.2 mg/dL (ref 8.9–10.3)
Chloride: 106 mmol/L (ref 98–111)
Creatinine: 1.04 mg/dL — ABNORMAL HIGH (ref 0.44–1.00)
GFR, Estimated: 56 mL/min — ABNORMAL LOW (ref >60)
Glucose, Bld: 90 mg/dL (ref 70–99)
Potassium: 3.8 mmol/L (ref 3.5–5.1)
Sodium: 140 mmol/L (ref 135–145)
Total Bilirubin: 0.8 mg/dL (ref 0.3–1.2)
Total Protein: 7.4 g/dL (ref 6.5–8.1)

## 2022-12-18 LAB — CBC AND DIFFERENTIAL
HCT: 37 (ref 36–46)
Hemoglobin: 12.4 (ref 12.0–16.0)
Neutrophils Absolute: 4.69
Platelets: 175 10*3/uL (ref 150–400)
WBC: 7

## 2022-12-18 LAB — CBC: RBC: 4.41 (ref 3.87–5.11)

## 2022-12-18 NOTE — Progress Notes (Signed)
[facility]  [address] [place],  [place]  [address] [phone]  Clinic Day:  12/18/2022  Referring physician: Marylen Ponto, MD   HISTORY OF PRESENT ILLNESS:  The patient is a 77 y.o. female who I was asked to evaluate for newly diagnosed medullary thyroid carcinoma.  Her history dates back to May 2024 when she passed out at a local store.  This led to her falling and bruising her jaw, neck, and chest wall.  She was taken to the emergency room for further evaluation.  While there, a chest CT was ordered, which unexpectedly revealed a mass in her mid right thyroid.  This was further confirmed by a thyroid ultrasound which showed a solid nodule measuring 1.5 x 1.4 x 1.0 cm.  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  She comes in today to go over these biopsy results and their implications.  The patient denies having any diarrhea or flushing over these past few months.  Her daughter claims that she has had chronic issues with dysphagia dating back numerous years.  However, the patient believes it has become more prominent recently.  There have also been issues with hoarseness.  She denies having any weight loss.  To her knowledge, there is no other family history of thyroid cancer or pheochromocytoma.  Her father has had a brain tumor, melanoma, and prostate cancer.  She has 4 sisters, 3 children, 4 grandchildren, and 5 great-grandchildren, none of whom have had any form of cancer.  Of note, this patient has been followed by me in the past for hemochromatosis (C282Y/H63D).  As her iron parameters have not been particularly high, she has not needed a phlebotomy in numerous months.  PHYSICAL EXAM:  Blood pressure 135/63, pulse (!) 53, temperature 98.5 F (36.9 C), resp. rate 16, height 5\' 6"  (1.676 m), weight 192 lb 12.8 oz (87.5 kg), SpO2 97 %. Wt Readings from Last 3 Encounters:  12/18/22 192 lb 12.8 oz (87.5 kg)  06/06/21 196 lb  3.2 oz (89 kg)  06/06/20 211 lb 12.8 oz (96.1 kg)   Body mass index is 31.12 kg/m. Performance status (ECOG): 1 - Symptomatic but completely ambulatory Physical Exam Constitutional:      Appearance: Normal appearance. She is not ill-appearing.  HENT:     Mouth/Throat:     Mouth: Mucous membranes are moist.     Pharynx: Oropharynx is clear. No oropharyngeal exudate or posterior oropharyngeal erythema.  Neck:     Comments: No obviously palpable neck mass Cardiovascular:     Rate and Rhythm: Normal rate and regular rhythm.     Heart sounds: No murmur heard.    No friction rub. No gallop.  Pulmonary:     Effort: Pulmonary effort is normal. No respiratory distress.     Breath sounds: Normal breath sounds. No wheezing, rhonchi or rales.  Abdominal:     General: Bowel sounds are normal. There is no distension.     Palpations: Abdomen is soft. There is no mass.     Tenderness: There is no abdominal tenderness.  Musculoskeletal:        General: No swelling.     Right lower leg: No edema.     Left lower leg: No edema.  Lymphadenopathy:     Cervical: No cervical adenopathy.     Upper Body:     Right upper body: No supraclavicular or axillary adenopathy.     Left upper body: No supraclavicular  or axillary adenopathy.     Lower Body: No right inguinal adenopathy. No left inguinal adenopathy.  Skin:    General: Skin is warm.     Coloration: Skin is not jaundiced.     Findings: No lesion or rash.  Neurological:     General: No focal deficit present.     Mental Status: She is alert and oriented to person, place, and time. Mental status is at baseline.  Psychiatric:        Mood and Affect: Mood normal.        Behavior: Behavior normal.        Thought Content: Thought content normal.    LABS:      Latest Ref Rng & Units 12/18/2022   12:00 AM 06/19/2022    1:55 PM 06/05/2021   12:00 AM  CBC  WBC  7.0     7.1  6.6      Hemoglobin 12.0 - 16.0 12.4     12.0  12.8      Hematocrit 36  - 46 37     38.5  38      Platelets 150 - 400 K/uL 175     188  172         This result is from an external source.      Latest Ref Rng & Units 12/18/2022    8:27 AM 06/19/2022    1:55 PM 06/05/2021   12:00 AM  CMP  Glucose 70 - 99 mg/dL 90  846    BUN 8 - 23 mg/dL 27  29  17       Creatinine 0.44 - 1.00 mg/dL 9.62  9.52  0.7      Sodium 135 - 145 mmol/L 140  140  139      Potassium 3.5 - 5.1 mmol/L 3.8  4.3  3.1      Chloride 98 - 111 mmol/L 106  108  102      CO2 22 - 32 mmol/L 27  24  29       Calcium 8.9 - 10.3 mg/dL 9.2  9.4  8.7      Total Protein 6.5 - 8.1 g/dL 7.4  7.3    Total Bilirubin 0.3 - 1.2 mg/dL 0.8  0.8    Alkaline Phos 38 - 126 U/L 60  46  49      AST 15 - 41 U/L 15  19  34      ALT 0 - 44 U/L 13  16  20          This result is from an external source.    ASSESSMENT & PLAN:  A 77 y.o. female who I was asked to consult upon for biopsy-proven medullary thyroid cancer.  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   I will check her calcitonin and CEA levels today.  This patient has already had CT scans of her chest/abdomen/pelvis which did not reveal any obvious malignancy elsewhere.  Nevertheless, for completeness, I will check serum and urine catecholamines to ensure a pheochromocytoma is not present.  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  I will also have this patient undergo a neck dedicated neck CT to ensure she has no regional lymphadenopathy that would suggest nodal metastasis  from her underlying medullary thyroid cancer.  If there is no evidence of distant metastasis, the patient understands that I would recommend her undergoing a total thyroidectomy, followed by a bilateral neck dissection.  I will see this patient back in 1 week to go over her  neck CT images, as well as all of her labs to determine how high a suspicion there is for her potentially having more regional or distant spread of disease.  The patient understands all the plans discussed today and is in agreement with them.  I do appreciate Marylen Ponto, MD for his new consult.   Jane Garcia Kirby Funk, MD

## 2022-12-18 NOTE — Progress Notes (Unsigned)
[facility]  [address] [place],  [place]  [address] [phone]  Clinic Day:  12/18/2022  Referring physician: No ref. provider found   HISTORY OF PRESENT ILLNESS:  The patient is a 77 y.o. female   PAST MEDICAL HISTORY:   Past Medical History:  Diagnosis Date   Allergy    Depression    Low blood pressure    Pigment cirrhosis (HCC)    Post-operative nausea and vomiting     PAST SURGICAL HISTORY:   Past Surgical History:  Procedure Laterality Date   ATRIAL FIBRILLATION ABLATION  2016   CHOLECYSTECTOMY  2017   COLONOSCOPY  2013   w/Dr.Gupta-normal exam per pt   PARTIAL HYSTERECTOMY     SHOULDER SURGERY     TONSILLECTOMY AND ADENOIDECTOMY  at 8   TUBAL LIGATION  1974   UPPER GASTROINTESTINAL ENDOSCOPY  08/14/2017   Gastric ulcers (biopsied). Mild gastritis. Status post empiric esophageal dilatation    CURRENT MEDICATIONS:   Current Outpatient Medications  Medication Sig Dispense Refill   alendronate (FOSAMAX) 35 MG tablet Take 1 tablet by mouth once a week.     aspirin 81 MG tablet Take 1 tablet by mouth daily.     diphenhydrAMINE (BENADRYL ALLERGY) 25 mg capsule Take 25 mg by mouth every 6 (six) hours as needed.     fludrocortisone (FLORINEF) 0.1 MG tablet Take 1 tablet by mouth daily.     fluticasone (FLONASE) 50 MCG/ACT nasal spray      omeprazole (PRILOSEC) 20 MG capsule Take 1 capsule (20 mg total) by mouth daily. Call [phone] to schedule virtual ov for more refills 90 capsule 0   potassium chloride (K-DUR) 10 MEQ tablet Take 1 tablet by mouth daily.     Probiotic Product (PROBIOTIC DAILY PO) Take 1 tablet by mouth daily.     sertraline (ZOLOFT) 50 MG tablet Take 1 tablet by mouth daily.     vitamin E (VITAMIN E) 400 UNIT capsule Take 400 Units by mouth daily.     Current Facility-Administered Medications  Medication Dose Route Frequency Provider Last Rate Last Admin   0.9 %  sodium chloride infusion  500 mL  Intravenous Once Lynann Bologna, MD        ALLERGIES:   Allergies  Allergen Reactions   Codeine Other (See Comments)    unknown    Latex Rash   Penicillins Rash    FAMILY HISTORY:   Family History  Problem Relation Age of Onset   Colon cancer Neg Hx    Esophageal cancer Neg Hx    Stomach cancer Neg Hx    Rectal cancer Neg Hx     SOCIAL HISTORY:   reports that she has never smoked. She has never used smokeless tobacco. She reports that she does not drink alcohol and does not use drugs.  REVIEW OF SYSTEMS:  Review of Systems - Oncology   PHYSICAL EXAM:  There were no vitals taken for this visit. Wt Readings from Last 3 Encounters:  06/06/21 196 lb 3.2 oz (89 kg)  06/06/20 211 lb 12.8 oz (96.1 kg)  07/23/19 212 lb 7 oz (96.4 kg)   There is no height or weight on file to calculate BMI. Performance status (ECOG): {CHL ONC [id number] Physical Exam  LABS:      Latest Ref Rng & Units 06/19/2022    1:55 PM 06/05/2021   12:00 AM 03/14/2021    2:41 PM  CBC  WBC 4.0 - 10.5  K/uL 7.1  6.6     9.2   Hemoglobin 12.0 - 15.0 g/dL 40.9  81.1     91.4   Hematocrit 36.0 - 46.0 % 38.5  38     38.1   Platelets 150 - 400 K/uL 188  172     194      This result is from an external source.      Latest Ref Rng & Units 06/19/2022    1:55 PM 06/05/2021   12:00 AM 03/14/2021    2:41 PM  CMP  Glucose 70 - 99 mg/dL 782   956   BUN 8 - 23 mg/dL 29  17     11    Creatinine 0.44 - 1.00 mg/dL 2.13  0.7     0.86   Sodium 135 - 145 mmol/L 140  139     141   Potassium 3.5 - 5.1 mmol/L 4.3  3.1     2.9   Chloride 98 - 111 mmol/L 108  102     105   CO2 22 - 32 mmol/L 24  29     28    Calcium 8.9 - 10.3 mg/dL 9.4  8.7     9.2   Total Protein 6.5 - 8.1 g/dL 7.3     Total Bilirubin 0.3 - 1.2 mg/dL 0.8     Alkaline Phos 38 - 126 U/L 46  49       AST 15 - 41 U/L 19  34       ALT 0 - 44 U/L 16  20          This result is from an external source.     No results found for: "CEA1", "CEA" /  No results found for: "CEA1", "CEA" No results found for: "PSA1" No results found for: "VHQ469" No results found for: "CAN125"  No results found for: "TOTALPROTELP", "ALBUMINELP", "A1GS", "A2GS", "BETS", "BETA2SER", "GAMS", "MSPIKE", "SPEI" Lab Results  Component Value Date   TIBC 320 06/19/2022   TIBC 337 06/05/2021   TIBC 304 06/03/2020   FERRITIN 161 06/19/2022   FERRITIN 47 06/05/2021   FERRITIN 117 06/03/2020   IRONPCTSAT 13 06/19/2022   IRONPCTSAT 15 06/05/2021   IRONPCTSAT 22 06/03/2020   No results found for: "LDH"  No results found for: "AFPTUMOR", "TOTALPROTELP", "ALBUMINELP", "A1GS", "A2GS", "BETS", "BETA2SER", "GAMS", "MSPIKE", "SPEI", "LDH", "CEA1", "CEA", "PSA1", "IGASERUM", "IGGSERUM", "IGMSERUM", "THGAB", "THYROGLB"  Review Flowsheet  More data may exist      Latest Ref Rng & Units 06/03/2020 06/05/2021 06/19/2022  Oncology Labs  Ferritin 11 - 307 ng/mL 117  47  161   %SAT 10.4 - 31.8 % 22  15  13      STUDIES:  No results found.   ASSESSMENT & PLAN:  A 77 y.o. female who I was asked to consult upon for *** .The patient understands all the plans discussed today and is in agreement with them.  I do appreciate No ref. provider found for his new consult.   Kirstin Kugler Kirby Funk, MD

## 2022-12-19 DIAGNOSIS — I480 Paroxysmal atrial fibrillation: Secondary | ICD-10-CM | POA: Diagnosis not present

## 2022-12-19 DIAGNOSIS — I272 Pulmonary hypertension, unspecified: Secondary | ICD-10-CM | POA: Diagnosis not present

## 2022-12-19 DIAGNOSIS — S0003XD Contusion of scalp, subsequent encounter: Secondary | ICD-10-CM | POA: Diagnosis not present

## 2022-12-19 DIAGNOSIS — I5032 Chronic diastolic (congestive) heart failure: Secondary | ICD-10-CM | POA: Diagnosis not present

## 2022-12-19 DIAGNOSIS — I951 Orthostatic hypotension: Secondary | ICD-10-CM | POA: Diagnosis not present

## 2022-12-19 DIAGNOSIS — F32A Depression, unspecified: Secondary | ICD-10-CM | POA: Diagnosis not present

## 2022-12-19 DIAGNOSIS — E041 Nontoxic single thyroid nodule: Secondary | ICD-10-CM | POA: Diagnosis not present

## 2022-12-19 DIAGNOSIS — D649 Anemia, unspecified: Secondary | ICD-10-CM | POA: Diagnosis not present

## 2022-12-19 DIAGNOSIS — I739 Peripheral vascular disease, unspecified: Secondary | ICD-10-CM | POA: Diagnosis not present

## 2022-12-20 LAB — CALCITONIN: Calcitonin: 1305 pg/mL — ABNORMAL HIGH (ref 0.0–5.0)

## 2022-12-21 LAB — CATECHOLAMINES, FRACTIONATED, PLASMA
Dopamine: <30 pg/mL (ref 0–48)
Epinephrine: <15 pg/mL (ref 0–62)
Norepinephrine: 421 pg/mL (ref 0–874)

## 2022-12-24 ENCOUNTER — Other Ambulatory Visit: Payer: Self-pay

## 2022-12-24 DIAGNOSIS — C73 Malignant neoplasm of thyroid gland: Secondary | ICD-10-CM | POA: Diagnosis not present

## 2022-12-24 DIAGNOSIS — R1310 Dysphagia, unspecified: Secondary | ICD-10-CM | POA: Diagnosis not present

## 2022-12-24 DIAGNOSIS — R490 Dysphonia: Secondary | ICD-10-CM | POA: Diagnosis not present

## 2022-12-24 DIAGNOSIS — E041 Nontoxic single thyroid nodule: Secondary | ICD-10-CM | POA: Diagnosis not present

## 2022-12-24 NOTE — Progress Notes (Signed)
[facility]  [address] [place],  [place]  [address] [phone]  Clinic Day:  12/25/2022  Referring physician: Marylen Ponto, MD   HISTORY OF PRESENT ILLNESS:  The patient is a 77 y.o. female who I recently began seeing for newly diagnosed medullary thyroid carcinoma.  She comes in today to go over her recent labs and scans to determine if she has more advanced disease or a concurrent malignancy/disorder that would qualify her for potentially having MEN 2A/2B syndrome.  Since her last visit, the patient has been doing well.  She denies having any neck discomfort or hoarseness which concern her for local progression of her medullary thyroid cancer.  She also denies having any systemic symptoms which concern her for metastatic disease potentially being present.    PHYSICAL EXAM:  Blood pressure 130/66, pulse 65, temperature 99 F (37.2 C), resp. rate 14, height 5\' 6"  (1.676 m), weight 193 lb 3.2 oz (87.6 kg), SpO2 94 %. Wt Readings from Last 3 Encounters:  12/25/22 193 lb 3.2 oz (87.6 kg)  12/18/22 192 lb 12.8 oz (87.5 kg)  06/06/21 196 lb 3.2 oz (89 kg)   Body mass index is 31.18 kg/m. Performance status (ECOG): 1 - Symptomatic but completely ambulatory Physical Exam Constitutional:      Appearance: Normal appearance. She is not ill-appearing.  HENT:     Mouth/Throat:     Mouth: Mucous membranes are moist.     Pharynx: Oropharynx is clear. No oropharyngeal exudate or posterior oropharyngeal erythema.  Neck:     Comments: No obviously palpable neck mass Cardiovascular:     Rate and Rhythm: Normal rate and regular rhythm.     Heart sounds: No murmur heard.    No friction rub. No gallop.  Pulmonary:     Effort: Pulmonary effort is normal. No respiratory distress.     Breath sounds: Normal breath sounds. No wheezing, rhonchi or rales.  Abdominal:     General: Bowel sounds are normal. There is no distension.     Palpations: Abdomen is soft.  There is no mass.     Tenderness: There is no abdominal tenderness.  Musculoskeletal:        General: No swelling.     Right lower leg: No edema.     Left lower leg: No edema.  Lymphadenopathy:     Cervical: No cervical adenopathy.     Upper Body:     Right upper body: No supraclavicular or axillary adenopathy.     Left upper body: No supraclavicular or axillary adenopathy.     Lower Body: No right inguinal adenopathy. No left inguinal adenopathy.  Skin:    General: Skin is warm.     Coloration: Skin is not jaundiced.     Findings: No lesion or rash.  Neurological:     General: No focal deficit present.     Mental Status: She is alert and oriented to person, place, and time. Mental status is at baseline.  Psychiatric:        Mood and Affect: Mood normal.        Behavior: Behavior normal.        Thought Content: Thought content normal.   SCANS:   1) Her neck CT revealed the following: FINDINGS: Pharynx and larynx: Oral cavity within normal limits. Oropharynx and nasopharynx within normal limits. No retropharyngeal collection or swelling. Negative epiglottis. Vallecula clear. Hypopharynx and supraglottic larynx within normal limits. Glottis symmetric and within normal limits.  Subglottic airway patent clear.  Salivary glands: Salivary glands including the parotid and submandibular glands are within normal limits.  Thyroid: Approximate 1.5 cm right thyroid nodule again noted. This has been previously evaluated by thyroid ultrasound and biopsy of most recently on 12/03/2022 (ref: J Am Coll Radiol. 2015 Feb;12(2): 143-50). Slight inferior extension of the left thyroid lobe noted (series 304, image 38).  Lymph nodes: No enlarged or pathologic adenopathy within the neck.  Vascular: Normal vascular enhancement seen throughout the neck. Atheromatous change about the aortic arch and carotid bifurcations noted.  Limited intracranial: Unremarkable.  Visualized orbits: Prior  bilateral ocular lens replacement. Senescent calcification noted on the right. Otherwise unremarkable.  Mastoids and visualized paranasal sinuses: Mild chronic mucoperiosteal thickening present about the ethmoidal air cells and maxillary sinuses. Visualized paranasal sinuses are otherwise clear. Mastoid air cells and middle ear cavities are well pneumatized and free of fluid.  Skeleton: Subcentimeter sclerotic focus within the posterior elements of T1 noted, likely a benign bone island. No worrisome or aggressive osseous lesions. Mild for age spondylosis present at C5-6 and C6-7.  Upper chest: 2 adjacent pulmonary nodules measuring up to 3 mm noted at peripheral right upper lobe (series 301, images 82, 81), indeterminate. No other acute finding.  Other: None.  IMPRESSION: 1. 1.5 cm right thyroid nodule, consistent with known thyroid carcinoma. This as bed previously evaluated by thyroid ultrasound and biopsy, most recently on 12/03/2022. 2. No adenopathy within the neck. 3. Two adjacent pulmonary nodules measuring up to 3 mm at the peripheral right upper lobe, indeterminate. Attention at follow-up recommended.  Aortic Atherosclerosis (ICD10-I70.0).  2) Her pre-biopsy thyroid ultrasound revealed the following: FINDINGS: Parenchymal Echotexture: Mildly heterogenous  Isthmus: 0.4 cm  Right lobe: 5.7 x 1.9 x 2.1 cm  Left lobe: 4.3 x 1.4 x 1.7 cm  _________________________________________________________  Estimated total number of nodules >/= 1 cm: 1  Number of spongiform nodules >/= 2 cm not described below (TR1): 0  Number of mixed cystic and solid nodules >/= 1.5 cm not described below (TR2):  _________________________________________________________  Nodule # 1:  Location: Right; Mid  Maximum size: 1.5 cm; Other 2 dimensions: 1.0 x 1.4 cm  Composition: solid/almost completely solid (2)  Echogenicity: hypoechoic (2)  Shape: taller-than-wide (3)  Margins:  smooth (0)  Echogenic foci: macrocalcifications (1)  ACR TI-RADS total points: 8.  ACR TI-RADS risk category: TR5 (>/= 7 points).  ACR TI-RADS recommendations:  **Given size (>/= 1.0 cm) and appearance, fine needle aspiration of this highly suspicious nodule should be considered based on TI-RADS criteria.  _________________________________________________________  IMPRESSION: Approximally 1.5 cm TI-RADS category 5 nodule in the right mid gland meets criteria to consider fine-needle aspiration biopsy. Biopsy is recommended.  3) A CT angiogram of her chest done in May 2024 revealed the following: FINDINGS: Cardiovascular: Satisfactory opacification of the pulmonary arteries to the segmental level. No evidence of pulmonary embolism. Nonaneurysmal aorta. No dissection. Common origin of the right brachiocephalic and left common carotid arteries. Aortic atherosclerosis. Cardiomegaly. No significant pericardial effusion.  Mediastinum/Nodes: Midline trachea. No suspicious lymph nodes. 1.5 cm hypodense nodule right lobe of thyroid. Esophagus within normal limits  Lungs/Pleura: No acute airspace disease or pleural effusion. Nodular focus of airspace disease in the right middle lobe. Similar appearance on 2017 exam and probably represents focal scarring.  Upper Abdomen: No acute finding  Musculoskeletal: No acute osseous abnormality.  Review of the MIP images confirms the above findings.  IMPRESSION: 1. Negative for acute pulmonary embolus or  aortic dissection. 2. Cardiomegaly. No acute airspace disease. 3. 1.5 cm hypodense nodule in the right lobe of the thyroid. Recommend thyroid US (ref: J Am Coll Radiol. 2015 Feb;12(2): 143-50).This should be performed on a nonemergent basis  Aortic Atherosclerosis (ICD10-I70.0).  4) An MRI of her abdomen in October 2023 revealed the following: FINDINGS: Lower chest: No acute findings.  Hepatobiliary: No hepatic masses identified.  Prior cholecystectomy. No evidence of biliary obstruction.  Pancreas: No mass or inflammatory changes.  Spleen: No evidence of splenomegaly. Multiple tiny sub-cm lesions are again seen throughout the spleen which show mild T2 hyperintensity and hypervascularity, without peripheral contrast enhancement. These are stable since previous study. These are consistent with benign etiology and may be due to granulomatous disease or tiny benign hemangiomas or lymphangiomas of the spleen.  Adrenals/Urinary Tract: No suspicious masses identified. Mild-to-moderate left hydroureteronephrosis shows no significant change compared to prior exam.  Stomach/Bowel: Unremarkable.  Vascular/Lymphatic: No pathologically enlarged lymph nodes identified. No acute vascular findings.  Other: None.  Musculoskeletal: No suspicious bone lesions identified.  IMPRESSION: Diffuse tiny splenic lesions remain stable, and almost certainly benign. Differential diagnosis includes granulomatous disease, hemangiomas or lymphangiomas. Recommend continued follow-up by MRI in 6-9 months to confirm continued stability.  No significant change in left hydroureteronephrosis.  LABS:      Latest Ref Rng & Units 12/18/2022   12:00 AM 06/19/2022    1:55 PM 06/05/2021   12:00 AM  CBC  WBC  7.0     7.1  6.6      Hemoglobin 12.0 - 16.0 12.4     12.0  12.8      Hematocrit 36 - 46 37     38.5  38      Platelets 150 - 400 K/uL 175     188  172         This result is from an external source.      Latest Ref Rng & Units 12/18/2022    8:27 AM 06/19/2022    1:55 PM 06/05/2021   12:00 AM  CMP  Glucose 70 - 99 mg/dL 90  578    BUN 8 - 23 mg/dL 27  29  17       Creatinine 0.44 - 1.00 mg/dL 4.69  6.29  0.7      Sodium 135 - 145 mmol/L 140  140  139      Potassium 3.5 - 5.1 mmol/L 3.8  4.3  3.1      Chloride 98 - 111 mmol/L 106  108  102      CO2 22 - 32 mmol/L 27  24  29       Calcium 8.9 - 10.3 mg/dL 9.2  9.4  8.7       Total Protein 6.5 - 8.1 g/dL 7.4  7.3    Total Bilirubin 0.3 - 1.2 mg/dL 0.8  0.8    Alkaline Phos 38 - 126 U/L 60  46  49      AST 15 - 41 U/L 15  19  34      ALT 0 - 44 U/L 13  16  20          This result is from an external source.    Latest Reference Range & Units 12/18/22 08:27  Calcitonin 0.0 - 5.0 pg/mL 1,305.0 (H)  (H): Data is abnormally high   Latest Reference Range & Units 12/18/22 08:27  Epinephrine 0 - 62 pg/mL <15  Norepinephrine 0 - 874 pg/mL  421  Dopamine 0 - 48 pg/mL <30   CEA  level pending  ASSESSMENT & PLAN:  A 77 y.o. female with medullary thyroid cancer.  In clinic today, I went over her neck CT images with her, for which he could see that there was no obvious evidence of nodal or distant metastasis.  This patient has also had other scans done recently for which no obvious distant disease spread is seen.  However, I am very concerned as her calcitonin level is very high, which suggests there may be more areas of medullary thyroid cancer than just in the right lobe of her thyroid.  I did speak with Dr. Gwyndolyn Kaufman, who has agreed to see her later this week to determine how to best surgically address her disease.  The patient understands she will likely need a central neck dissection, which would include taking the bilateral regional nodal chains adjacent to her thyroid tissue to determine if nodal spread has occurred.  Moving forward, I will see this patient back in 2 months; her calcitonin level will be rechecked at that time to see if this level has appropriately fallen after her upcoming thyroidectomy and central neck dissection.  The patient understands all the plans discussed today and is in agreement with them.  Husein Guedes Kirby Funk, MD

## 2022-12-25 ENCOUNTER — Inpatient Hospital Stay: Payer: Medicare HMO | Admitting: Oncology

## 2022-12-25 VITALS — BP 130/66 | HR 65 | Temp 99.0°F | Resp 14 | Ht 66.0 in | Wt 193.2 lb

## 2022-12-25 DIAGNOSIS — C73 Malignant neoplasm of thyroid gland: Secondary | ICD-10-CM | POA: Diagnosis not present

## 2022-12-26 ENCOUNTER — Other Ambulatory Visit: Payer: Self-pay | Admitting: Oncology

## 2022-12-26 DIAGNOSIS — C73 Malignant neoplasm of thyroid gland: Secondary | ICD-10-CM

## 2022-12-27 DIAGNOSIS — C73 Malignant neoplasm of thyroid gland: Secondary | ICD-10-CM | POA: Diagnosis not present

## 2022-12-27 LAB — UPEP/UIFE/LIGHT CHAINS/TP, 24-HR UR
Free Kappa Lt Chains,Ur: 19.04 mg/L (ref 1.17–86.46)
Free Kappa/Lambda Ratio: 5.53 (ref 1.83–14.26)
Free Lambda Lt Chains,Ur: 3.44 mg/L (ref 0.27–15.21)
Total Protein, Urine-Ur/day: 173 mg/24 hr — ABNORMAL HIGH (ref 30–150)
Total Protein, Urine: 11.5 mg/dL
Total Volume: 1500

## 2022-12-28 LAB — CEA: CEA: 22.3 ng/mL — ABNORMAL HIGH (ref 0.0–4.7)

## 2023-01-02 DIAGNOSIS — E669 Obesity, unspecified: Secondary | ICD-10-CM | POA: Diagnosis not present

## 2023-01-02 DIAGNOSIS — Z01818 Encounter for other preprocedural examination: Secondary | ICD-10-CM | POA: Diagnosis not present

## 2023-01-02 DIAGNOSIS — Z6831 Body mass index (BMI) 31.0-31.9, adult: Secondary | ICD-10-CM | POA: Diagnosis not present

## 2023-01-02 DIAGNOSIS — I495 Sick sinus syndrome: Secondary | ICD-10-CM | POA: Diagnosis not present

## 2023-01-02 DIAGNOSIS — I959 Hypotension, unspecified: Secondary | ICD-10-CM | POA: Diagnosis not present

## 2023-01-02 DIAGNOSIS — Z7982 Long term (current) use of aspirin: Secondary | ICD-10-CM | POA: Diagnosis not present

## 2023-01-02 DIAGNOSIS — I4891 Unspecified atrial fibrillation: Secondary | ICD-10-CM | POA: Diagnosis not present

## 2023-01-02 DIAGNOSIS — R002 Palpitations: Secondary | ICD-10-CM | POA: Diagnosis not present

## 2023-01-02 DIAGNOSIS — C73 Malignant neoplasm of thyroid gland: Secondary | ICD-10-CM | POA: Diagnosis not present

## 2023-01-04 ENCOUNTER — Inpatient Hospital Stay: Payer: Medicare HMO | Attending: Oncology | Admitting: Genetic Counselor

## 2023-01-04 ENCOUNTER — Encounter: Payer: Self-pay | Admitting: Genetic Counselor

## 2023-01-04 ENCOUNTER — Inpatient Hospital Stay: Payer: Medicare HMO

## 2023-01-04 ENCOUNTER — Other Ambulatory Visit: Payer: Self-pay

## 2023-01-04 DIAGNOSIS — Z803 Family history of malignant neoplasm of breast: Secondary | ICD-10-CM | POA: Diagnosis not present

## 2023-01-04 DIAGNOSIS — C73 Malignant neoplasm of thyroid gland: Secondary | ICD-10-CM

## 2023-01-04 NOTE — Progress Notes (Signed)
REFERRING PROVIDER: Weston Settle, MD [address] [place],  [place] [address]   PRIMARY PROVIDER:  Marylen Ponto, MD  PRIMARY REASON FOR VISIT:  Encounter Diagnoses  Name Primary?   Medullary carcinoma of thyroid (HCC) Yes   Family history of breast cancer     HISTORY OF PRESENT ILLNESS:   Jane Garcia, a 77 y.o. female, was seen for a [place] cancer genetics consultation at the request of Dr. Melvyn Neth due to a personal history of medullary thyroid cancer.  Jane Garcia presents to clinic today to discuss the possibility of a hereditary predisposition to cancer, to discuss genetic testing, and to further clarify her future cancer risks, as well as potential cancer risks for family members.   In 2024, at the age of 72, Jane Garcia was diagnosed with medullary thyroid cancer.  The treatment plan includes thyroidectomy (scheduled 01/09/2023).   CANCER HISTORY:  Oncology History   No history exists.    RISK FACTORS:  Number of breast biopsies: 0. Colonoscopy: yes;  November 2013 . Hysterectomy: yes in 1978 due to fibroids Ovaries intact: yes.  Menarche was at age 35 or 25.  First live birth at age 37.  HRT use: yes; for more than 5 years total.    Past Medical History:  Diagnosis Date   Allergy    Depression    Low blood pressure    Pigment cirrhosis (HCC)    Post-operative nausea and vomiting     Past Surgical History:  Procedure Laterality Date   ATRIAL FIBRILLATION ABLATION  2016   CHOLECYSTECTOMY  2017   COLONOSCOPY  2013   w/Dr.Gupta-normal exam per pt   PARTIAL HYSTERECTOMY     SHOULDER SURGERY     TONSILLECTOMY AND ADENOIDECTOMY  at 8   TUBAL LIGATION  1974   UPPER GASTROINTESTINAL ENDOSCOPY  08/14/2017   Gastric ulcers (biopsied). Mild gastritis. Status post empiric esophageal dilatation    FAMILY HISTORY:  We obtained a detailed, 4-generation family history.  Significant diagnoses are listed below: Family History  Problem Relation Age of Onset    Brain cancer Father        dx 17s?   Melanoma Father        back   Breast cancer Niece        dx<50   Breast cancer Cousin        dx <50; pat female cousin       Jane Garcia is unaware of previous family history of genetic testing for hereditary cancer risks. There is no reported Ashkenazi Jewish ancestry. There is no known consanguinity.  GENETIC COUNSELING ASSESSMENT: Jane Garcia is a 77 y.o. female with a personal history which is somewhat suggestive of a hereditary cancer syndrome and predisposition to cancer given her history of medullary thyroid cancer. We, therefore, discussed and recommended the following at today's visit.   DISCUSSION: We discussed that more than 25% of medullary thyroid cancer is hereditary.  Most cases of hereditary medullary thyroid cancer are associated with mutations in the RET gene.  There are other genes that can be associated with hereditary cancer syndromes.  We discussed that testing is beneficial for several reasons including knowing how to follow individuals for their cancer risks and understanding if other family members could be at risk for cancer and allowing them to undergo genetic testing.   We reviewed the characteristics, features and inheritance patterns of hereditary cancer syndromes. We also discussed genetic testing, including the appropriate family members to  test, the process of testing, insurance coverage and turn-around-time for results. We discussed the implications of a negative, positive, carrier and/or variant of uncertain significant result. We recommended Jane Garcia pursue genetic testing for a panel that includes genes associated with thyroid cancer, breast cancer, melanoma, and other cancers.  The CancerNext-Expanded gene panel offered by [facility] and includes sequencing, rearrangement, and RNA analysis for the following 77 genes: AIP, ALK, APC, ATM, AXIN2, BAP1, BARD1, BLM, BMPR1A, BRCA1, BRCA2, BRIP1, CDC73, CDH1, CDK4,  CDKN1B, CDKN2A, CHEK2, CTNNA1, DICER1, FANCC, FH, FLCN, GALNT12, KIF1B, LZTR1, MAX, MEN1, MET, MLH1, MSH2, MSH3, MSH6, MUTYH, NBN, NF1, NF2, NTHL1, PALB2, PHOX2B, PMS2, POT1, PRKAR1A, PTCH1, PTEN, RAD51C, RAD51D, RB1, RECQL, RET, SDHA, SDHAF2, SDHB, SDHC, SDHD, SMAD4, SMARCA4, SMARCB1, SMARCE1, STK11, SUFU, TMEM127, TP53, TSC1, TSC2, VHL and XRCC2 (sequencing and deletion/duplication); EGFR, EGLN1, HOXB13, KIT, MITF, PDGFRA, POLD1, and POLE (sequencing only); EPCAM and GREM1 (deletion/duplication only).    Based on Jane Garcia's personal history of medullary thyroid cancer cancer, she meets NCCN medical criteria for genetic testing. Despite that she meets criteria, she may still have an out of pocket cost. We discussed that if her out of pocket cost for testing is over $100, the laboratory should contact her and discuss the self-pay prices and/or patient pay assistance programs.    PLAN: After considering the risks, benefits, and limitations, Jane Garcia provided informed consent to pursue genetic testing and the blood sample was sent to [facility] for analysis of the CancerNext-Expanded +RNAinsight Panel. Results should be available within approximately 3 weeks' time, at which point they will be disclosed by telephone to Jane Garcia, as will any additional recommendations warranted by these results. Jane Garcia will receive a summary of her genetic counseling visit and a copy of her results once available. This information will also be available in Epic.    Jane Garcia questions were answered to her satisfaction today. Our contact information was provided should additional questions or concerns arise. Thank you for the referral and allowing [place] to share in the care of your patient.   Jane Garcia M. Jane Plowman, MS, [facility] Genetic Counselor [email or web address] (P) [phone]  The patient was seen for a total of 35 minutes in face-to-face genetic counseling.  The patient was seen alone.  Drs.  Melvyn Neth and/or Gilman Buttner were available to discuss this case as needed.    _______________________________________________________________________ For Office Staff:  Number of people involved in session: 1 Was an Intern/ student involved with case: no

## 2023-01-09 ENCOUNTER — Encounter: Payer: Self-pay | Admitting: Oncology

## 2023-01-09 DIAGNOSIS — C73 Malignant neoplasm of thyroid gland: Secondary | ICD-10-CM | POA: Diagnosis not present

## 2023-01-09 DIAGNOSIS — K219 Gastro-esophageal reflux disease without esophagitis: Secondary | ICD-10-CM | POA: Diagnosis not present

## 2023-01-09 DIAGNOSIS — F419 Anxiety disorder, unspecified: Secondary | ICD-10-CM | POA: Diagnosis not present

## 2023-01-09 DIAGNOSIS — Z6831 Body mass index (BMI) 31.0-31.9, adult: Secondary | ICD-10-CM | POA: Diagnosis not present

## 2023-01-09 DIAGNOSIS — Z7952 Long term (current) use of systemic steroids: Secondary | ICD-10-CM | POA: Diagnosis not present

## 2023-01-09 DIAGNOSIS — E669 Obesity, unspecified: Secondary | ICD-10-CM | POA: Diagnosis not present

## 2023-01-09 DIAGNOSIS — F32A Depression, unspecified: Secondary | ICD-10-CM | POA: Diagnosis not present

## 2023-01-09 DIAGNOSIS — I951 Orthostatic hypotension: Secondary | ICD-10-CM | POA: Diagnosis not present

## 2023-01-09 DIAGNOSIS — I480 Paroxysmal atrial fibrillation: Secondary | ICD-10-CM | POA: Diagnosis not present

## 2023-01-22 ENCOUNTER — Ambulatory Visit: Payer: Self-pay | Admitting: Genetic Counselor

## 2023-01-22 ENCOUNTER — Telehealth: Payer: Self-pay | Admitting: Genetic Counselor

## 2023-01-22 DIAGNOSIS — Z803 Family history of malignant neoplasm of breast: Secondary | ICD-10-CM

## 2023-01-22 DIAGNOSIS — Z1379 Encounter for other screening for genetic and chromosomal anomalies: Secondary | ICD-10-CM

## 2023-01-22 DIAGNOSIS — C73 Malignant neoplasm of thyroid gland: Secondary | ICD-10-CM

## 2023-01-22 NOTE — Telephone Encounter (Signed)
Patient had questions about text she received from [facility].  Encouraged her to call Lendon Collar to discuss billing options.   Disclosed negative genetics and VUS in [facility].

## 2023-01-24 DIAGNOSIS — C73 Malignant neoplasm of thyroid gland: Secondary | ICD-10-CM | POA: Diagnosis not present

## 2023-01-31 ENCOUNTER — Encounter: Payer: Self-pay | Admitting: Genetic Counselor

## 2023-01-31 ENCOUNTER — Encounter: Payer: Self-pay | Admitting: Oncology

## 2023-01-31 DIAGNOSIS — Z1379 Encounter for other screening for genetic and chromosomal anomalies: Secondary | ICD-10-CM | POA: Insufficient documentation

## 2023-01-31 NOTE — Progress Notes (Signed)
HPI:   Jane Garcia was previously seen in the [facility] clinic due to a personal history of medullary thyroid cancer and concerns regarding a hereditary predisposition to cancer.    Ms. Sciulli recent genetic test results were disclosed to her by telephone. These results and recommendations are discussed in more detail below.  CANCER HISTORY:  Oncology History  Medullary carcinoma of thyroid (HCC)  12/18/2022 Initial Diagnosis   Medullary carcinoma of thyroid (HCC)   01/14/2023 Genetic Testing   Negative [facility] CancerNext-Expanded +RNAinsight Panel.  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 Report date is 01/14/2023.   The CancerNext-Expanded gene panel offered by [facility] and includes sequencing, rearrangement, and RNA analysis for the following 71 genes:  AIP, ALK, APC, ATM, BAP1, BARD1, BMPR1A, BRCA1, BRCA2, BRIP1, CDC73, CDH1, CDK4, CDKN1B, CDKN2A, CHEK2, DICER1, FH, FLCN, KIF1B, LZTR1,MAX, MEN1, MET, MLH1, MSH2, MSH6, MUTYH, NF1, NF2, NTHL1, PALB2, PHOX2B, PMS2, POT1, PRKAR1A, PTCH1, PTEN, RAD51C,RAD51D, RB1, RET, SDHA, SDHAF2, SDHB, SDHC, SDHD, SMAD4, SMARCA4, SMARCB1, SMARCE1, STK11, SUFU, TMEM127, TP53,TSC1, TSC2 and VHL (sequencing and deletion/duplication); AXIN2, CTNNA1, EGFR, EGLN1, HOXB13, KIT, MITF, MSH3, PDGFRA, POLD1 and POLE (sequencing only); EPCAM and GREM1 (deletion/duplication only).      FAMILY HISTORY:  We obtained a detailed, 4-generation family history.  Significant diagnoses are listed below:      Family History  Problem Relation Age of Onset   Brain cancer Father          dx 64s?   Melanoma Father          back   Breast cancer Niece          dx<50   Breast cancer Cousin          dx <50; pat female cousin           Jane Garcia is unaware of previous family history of genetic testing for hereditary cancer risks. There is no reported Ashkenazi Jewish ancestry. There is no known consanguinity.    GENETIC TEST RESULTS:  The [facility]  CancerNext-Expanded +RNAinsight Panel found no pathogenic mutations.   The CancerNext-Expanded gene panel offered by [facility] and includes sequencing, rearrangement, and RNA analysis for the following 71 genes:  AIP, ALK, APC, ATM, BAP1, BARD1, BMPR1A, BRCA1, BRCA2, BRIP1, CDC73, CDH1, CDK4, CDKN1B, CDKN2A, CHEK2, DICER1, FH, FLCN, KIF1B, LZTR1,MAX, MEN1, MET, MLH1, MSH2, MSH6, MUTYH, NF1, NF2, NTHL1, PALB2, PHOX2B, PMS2, POT1, PRKAR1A, PTCH1, PTEN, RAD51C,RAD51D, RB1, RET, SDHA, SDHAF2, SDHB, SDHC, SDHD, SMAD4, SMARCA4, SMARCB1, SMARCE1, STK11, SUFU, TMEM127, TP53,TSC1, TSC2 and VHL (sequencing and deletion/duplication); AXIN2, CTNNA1, EGFR, EGLN1, HOXB13, KIT, MITF, MSH3, PDGFRA, POLD1 and POLE (sequencing only); EPCAM and GREM1 (deletion/duplication only).   The test report has been scanned into EPIC and is located under the Molecular Pathology section of the Results Review tab.  A portion of the result report is included below for reference. Genetic testing reported out on January 14, 2023.     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  At this time, it is unknown if this variant is associated with an increased risk for cancer or if it is benign, but most uncertain variants are reclassified to benign. It should not be used to make medical management decisions. With time, we suspect the laboratory will determine the significance of this variant, if any. If the laboratory reclassifies this variant, we will attempt to contact Jane Garcia to discuss it further.   Even though a pathogenic variant  was not identified, possible explanations for the cancer in the family may include: There may be no hereditary risk for cancer in the family. The cancers in Jane Garcia and/or her family may be sporadic/familial or due to other genetic and environmental factors. There may be a gene mutation in one of these genes that current testing methods  cannot detect but that chance is small. There could be another gene that has not yet been discovered, or that we have not yet tested, that is responsible for the cancer diagnoses in the family.  It is also possible there is a hereditary cause for the cancer in the family that Jane Garcia did not inherit.   ADDITIONAL GENETIC TESTING:   Ms. Mckeel genetic testing was fairly extensive.  If there are additional relevant genes identified to increase cancer risk that can be analyzed in the future, we would be happy to discuss and coordinate this testing at that time.     CANCER SCREENING RECOMMENDATIONS:  Ms. Sabatelli test result is considered negative (normal).  This means that we have not identified a hereditary cause for her personal history of medullary thyroid cancer at this time.    An individual's cancer risk and medical management are not determined by genetic test results alone. Overall cancer risk assessment incorporates additional factors, including personal medical history, family history, and any available genetic information that may result in a personalized plan for cancer prevention and surveillance. Therefore, it is recommended she continue to follow the cancer management and screening guidelines provided by her oncology and primary healthcare provider.  RECOMMENDATIONS FOR FAMILY MEMBERS:   Since she did not inherit a identifiable mutation in a cancer predisposition gene included on this panel, her children could not have inherited a known mutation from her in one of these genes. Individuals in this family might be at some increased risk of developing cancer, over the general population risk, due to the family history of cancer.  Individuals in the family should notify their providers of the family history of cancer.  Other members of the family may still carry a pathogenic variant in one of these genes that Jane Garcia did not inherit. Based on the family history, we recommend her  paternal cousin, who was diagnosed with breast cancer before age 17, have genetic counseling and testing. Jane Garcia can let us know if we can be of any assistance in coordinating genetic counseling and/or testing for these family member.   We do not recommend familial testing for the MLH1 variant of uncertain significance (VUS).  FOLLOW-UP:  Cancer genetics is a rapidly advancing field and it is possible that new genetic tests will be appropriate for her and/or her family members in the future. We encourage Jane Garcia to remain in contact with cancer genetics, so we can update her personal and family histories and let her know of advances in cancer genetics that may benefit this family.   Our contact number was provided.  She knows she is welcome to call us at anytime with additional questions or concerns.   Marlee Armenteros M. Rennie Plowman, MS, [facility] Genetic Counselor [email or web address] (P) [phone]

## 2023-02-05 DIAGNOSIS — R0981 Nasal congestion: Secondary | ICD-10-CM | POA: Diagnosis not present

## 2023-02-05 DIAGNOSIS — R059 Cough, unspecified: Secondary | ICD-10-CM | POA: Diagnosis not present

## 2023-02-22 ENCOUNTER — Inpatient Hospital Stay: Payer: Medicare HMO

## 2023-02-25 ENCOUNTER — Inpatient Hospital Stay: Payer: Medicare HMO | Admitting: Oncology

## 2023-02-26 DIAGNOSIS — Z9889 Other specified postprocedural states: Secondary | ICD-10-CM | POA: Diagnosis not present

## 2023-02-26 DIAGNOSIS — Z8679 Personal history of other diseases of the circulatory system: Secondary | ICD-10-CM | POA: Diagnosis not present

## 2023-02-26 DIAGNOSIS — I951 Orthostatic hypotension: Secondary | ICD-10-CM | POA: Diagnosis not present

## 2023-02-26 DIAGNOSIS — R001 Bradycardia, unspecified: Secondary | ICD-10-CM | POA: Diagnosis not present

## 2023-02-26 DIAGNOSIS — R079 Chest pain, unspecified: Secondary | ICD-10-CM | POA: Diagnosis not present

## 2023-02-26 DIAGNOSIS — C73 Malignant neoplasm of thyroid gland: Secondary | ICD-10-CM | POA: Diagnosis not present

## 2023-02-27 ENCOUNTER — Inpatient Hospital Stay: Payer: Medicare HMO | Attending: Oncology

## 2023-02-27 DIAGNOSIS — R001 Bradycardia, unspecified: Secondary | ICD-10-CM | POA: Diagnosis not present

## 2023-02-27 DIAGNOSIS — C73 Malignant neoplasm of thyroid gland: Secondary | ICD-10-CM | POA: Insufficient documentation

## 2023-02-27 DIAGNOSIS — R7989 Other specified abnormal findings of blood chemistry: Secondary | ICD-10-CM | POA: Insufficient documentation

## 2023-02-27 LAB — CBC WITH DIFFERENTIAL (CANCER CENTER ONLY)
Abs Immature Granulocytes: 0.02 10*3/uL (ref 0.00–0.07)
Basophils Absolute: 0.1 10*3/uL (ref 0.0–0.1)
Basophils Relative: 1 %
Eosinophils Absolute: 0.3 10*3/uL (ref 0.0–0.5)
Eosinophils Relative: 4 %
HCT: 36.9 % (ref 36.0–46.0)
Hemoglobin: 12 g/dL (ref 12.0–15.0)
Immature Granulocytes: 0 %
Lymphocytes Relative: 21 %
Lymphs Abs: 1.5 10*3/uL (ref 0.7–4.0)
MCH: 27.5 pg (ref 26.0–34.0)
MCHC: 32.5 g/dL (ref 30.0–36.0)
MCV: 84.4 fL (ref 80.0–100.0)
Monocytes Absolute: 0.6 10*3/uL (ref 0.1–1.0)
Monocytes Relative: 8 %
Neutro Abs: 4.7 10*3/uL (ref 1.7–7.7)
Neutrophils Relative %: 66 %
Platelet Count: 183 10*3/uL (ref 150–400)
RBC: 4.37 MIL/uL (ref 3.87–5.11)
RDW: 13.9 % (ref 11.5–15.5)
WBC Count: 7.2 10*3/uL (ref 4.0–10.5)
nRBC: 0 % (ref 0.0–0.2)

## 2023-02-27 LAB — CMP (CANCER CENTER ONLY)
ALT: 15 U/L (ref 0–44)
AST: 19 U/L (ref 15–41)
Albumin: 3.8 g/dL (ref 3.5–5.0)
Alkaline Phosphatase: 54 U/L (ref 38–126)
Anion gap: 11 (ref 5–15)
BUN: 25 mg/dL — ABNORMAL HIGH (ref 8–23)
CO2: 25 mmol/L (ref 22–32)
Calcium: 9.4 mg/dL (ref 8.9–10.3)
Chloride: 102 mmol/L (ref 98–111)
Creatinine: 1.08 mg/dL — ABNORMAL HIGH (ref 0.44–1.00)
GFR, Estimated: 53 mL/min — ABNORMAL LOW (ref >60)
Glucose, Bld: 103 mg/dL — ABNORMAL HIGH (ref 70–99)
Potassium: 3.8 mmol/L (ref 3.5–5.1)
Sodium: 138 mmol/L (ref 135–145)
Total Bilirubin: 1.1 mg/dL (ref 0.3–1.2)
Total Protein: 6.9 g/dL (ref 6.5–8.1)

## 2023-03-01 ENCOUNTER — Other Ambulatory Visit: Payer: Self-pay | Admitting: Oncology

## 2023-03-01 ENCOUNTER — Inpatient Hospital Stay: Payer: Medicare HMO | Admitting: Oncology

## 2023-03-01 VITALS — BP 129/58 | HR 71 | Temp 98.7°F | Resp 14 | Ht 66.0 in | Wt 193.6 lb

## 2023-03-01 DIAGNOSIS — C73 Malignant neoplasm of thyroid gland: Secondary | ICD-10-CM | POA: Diagnosis not present

## 2023-03-01 DIAGNOSIS — R1032 Left lower quadrant pain: Secondary | ICD-10-CM | POA: Diagnosis not present

## 2023-03-01 DIAGNOSIS — N390 Urinary tract infection, site not specified: Secondary | ICD-10-CM | POA: Diagnosis not present

## 2023-03-01 DIAGNOSIS — N200 Calculus of kidney: Secondary | ICD-10-CM | POA: Diagnosis not present

## 2023-03-01 NOTE — Progress Notes (Signed)
[facility]  [address] [place],  [place]  [address] [phone]  Clinic Day:  03/01/2023  Referring physician: Marylen Ponto, MD   HISTORY OF PRESENT ILLNESS:  The patient is a 77 y.o. female who I recently began seeing for newly diagnosed medullary thyroid carcinoma.  She comes in today to go over the pathology from her recent total thyroidectomy.   Since her surgery, she has been doing well.  She currently takes Synthroid for her thyroid replacement.  She denies having any neck discomfort, fullness, or other local findings which concern her for early disease recurrence.    PHYSICAL EXAM:  Blood pressure (!) 129/58, pulse 71, temperature 98.7 F (37.1 C), resp. rate 14, height 5\' 6"  (1.676 m), weight 193 lb 9.6 oz (87.8 kg), SpO2 93%. Wt Readings from Last 3 Encounters:  03/01/23 193 lb 9.6 oz (87.8 kg)  12/25/22 193 lb 3.2 oz (87.6 kg)  12/18/22 192 lb 12.8 oz (87.5 kg)   Body mass index is 31.25 kg/m. Performance status (ECOG): 1 - Symptomatic but completely ambulatory Physical Exam Constitutional:      Appearance: Normal appearance. She is not ill-appearing.  HENT:     Mouth/Throat:     Mouth: Mucous membranes are moist.     Pharynx: Oropharynx is clear. No oropharyngeal exudate or posterior oropharyngeal erythema.  Neck:     Comments: No palpable neck mass; her surgical scar appears well-healed  Cardiovascular:     Rate and Rhythm: Normal rate and regular rhythm.     Heart sounds: No murmur heard.    No friction rub. No gallop.  Pulmonary:     Effort: Pulmonary effort is normal. No respiratory distress.     Breath sounds: Normal breath sounds. No wheezing, rhonchi or rales.  Abdominal:     General: Bowel sounds are normal. There is no distension.     Palpations: Abdomen is soft. There is no mass.     Tenderness: There is no abdominal tenderness.  Musculoskeletal:        General: No swelling.     Right lower leg: No edema.      Left lower leg: No edema.  Lymphadenopathy:     Cervical: No cervical adenopathy.     Upper Body:     Right upper body: No supraclavicular or axillary adenopathy.     Left upper body: No supraclavicular or axillary adenopathy.     Lower Body: No right inguinal adenopathy. No left inguinal adenopathy.  Skin:    General: Skin is warm.     Coloration: Skin is not jaundiced.     Findings: No lesion or rash.  Neurological:     General: No focal deficit present.     Mental Status: She is alert and oriented to person, place, and time. Mental status is at baseline.  Psychiatric:        Mood and Affect: Mood normal.        Behavior: Behavior normal.        Thought Content: Thought content normal.   PATHOLOGY: Final Diagnosis   A. THYROID, TOTAL THYROIDECTOMY:               Medullary thyroid carcinoma.  Tumor measures 1.2 cm in maximum gross dimension.              Margins uninvolved by carcinoma.               See comment.  B. LYMPH NODE, RIGHT LEVEL 6 NODES, EXCISION:                Fibroadipose tissue.              No lymphoid tissue identified.    C. LYMPH NODE, LEFT LEVEL 6 NODES, EXCISION:                One lymph node negative for malignancy (0/1).     Electronically signed by Juanda Crumble, MD on 01/15/2023 at  1:20 PM  Comment   Immunoperoxidase studies show the neoplasm is positive for INSM1, synaptophysin, and calcitonin. KI 67 shows a low proliferation rate, staining 1% of the tumor nuclei.     Representative tumor block: A2, A3   Controls worked appropriately.  Dr. Wallene Dales has reviewed selected slides for confirmation of malignancy.    Synoptic Checklist THYROID GLAND THYROID GLAND: RESECTION - All Specimens 8th Edition - Protocol posted: 09/20/2021  SPECIMEN    Procedure:    Total thyroidectomy  TUMOR    Tumor Focality:    Unifocal    Tumor Characteristics:          Tumor Site:    Right lobe      Tumor Size:    Greatest Dimension  (Centimeters): 1.2 cm      Histologic Tumor Types and Subtypes:            :    Medullary thyroid carcinoma      Tumor Proliferative Activity:            Mitotic Rate:    Less than 3 mitoses per 48mm2        :    0 mitoses per 2 mm2      Ki-67 Labeling Index:    1 %        Methodology:    Manual count    Tumor Necrosis:    Not identified    Angioinvasion (vascular invasion):    Not identified    Lymphatic Invasion:    Not identified    Extrathyroidal Extension:    Not identified    Margin Status:    All margins negative for carcinoma  REGIONAL LYMPH NODES  Regional Lymph Node Status:        :    All regional lymph nodes negative for tumor    Number of Lymph Nodes Examined:    1      Nodal Level(s) Examined:    Level VI  pTNM CLASSIFICATION (AJCC 8th Edition)    Reporting of pT, pN, and (when applicable) pM categories is based on information available to the pathologist at the time the report is issued. As per the AJCC (Chapter 1, 8th Ed.) it is the managing physician's responsibility to establish the final pathologic stage based upon all pertinent information, including but potentially not limited to this pathology report.    pT Category:    pT1b  pN Category:    pN0a    LABS:      Latest Ref Rng & Units 02/27/2023    4:03 PM 12/18/2022   12:00 AM 06/19/2022    1:55 PM  CBC  WBC 4.0 - 10.5 K/uL 7.2  7.0     7.1   Hemoglobin 12.0 - 15.0 g/dL 16.1  09.6     04.5   Hematocrit 36.0 - 46.0 % 36.9  37     38.5   Platelets 150 - 400 K/uL 183  175  188      This result is from an external source.      Latest Ref Rng & Units 02/27/2023    4:03 PM 12/18/2022    8:27 AM 06/19/2022    1:55 PM  CMP  Glucose 70 - 99 mg/dL 161  90  096   BUN 8 - 23 mg/dL 25  27  29    Creatinine 0.44 - 1.00 mg/dL 0.45  4.09  8.11   Sodium 135 - 145 mmol/L 138  140  140   Potassium 3.5 - 5.1 mmol/L 3.8  3.8  4.3   Chloride 98 - 111 mmol/L 102  106  108   CO2 22 - 32 mmol/L 25  27  24    Calcium 8.9  - 10.3 mg/dL 9.4  9.2  9.4   Total Protein 6.5 - 8.1 g/dL 6.9  7.4  7.3   Total Bilirubin 0.3 - 1.2 mg/dL 1.1  0.8  0.8   Alkaline Phos 38 - 126 U/L 54  60  46   AST 15 - 41 U/L 19  15  19    ALT 0 - 44 U/L 15  13  16      Latest Reference Range & Units 12/18/22 08:27 02/27/23 16:03  Calcitonin 0.0 - 5.0 pg/mL 1,305.0 (H) <2.0   ASSESSMENT & PLAN:  A 77 y.o. female with stage I (T1b N0 M0) medullary thyroid cancer, status post a total thyroidectomy in July 2024.  I am very pleased with her labs today, which show an undetectable calcitonin level, which reflects efficacy of her total thyroidectomy removing all of her medullary thyroid cancer.  Her CEA level is pending.  Based upon her physical exam today, as well as her labs, it does appear that she has been a disease-free from her total thyroidectomy.  I will keep her elevated CEA level is significant available.  Otherwise, we will see the patient back in 6 months for repeat clinical assessment.  The patient understands all the plans discussed today and is in agreement with them.  ADDENDUM:  Latest Reference Range & Units 12/18/22 08:27  03/20/23 13:46  CEA 0.0 - 4.7 ng/mL 22.3 (H)  2.6  (H): Data is abnormally high  Her normal CEA level after her total thyroidectomy also lends credence to the belief that she has been a disease-free from this surgery.  Clinically, she is doing well.  She is already scheduled to see me back in early March 2025 for repeat clinical assessment.    Jane Taira Kirby Funk, MD

## 2023-03-05 ENCOUNTER — Telehealth: Payer: Self-pay

## 2023-03-05 NOTE — Telephone Encounter (Signed)
CEA & Calcitonin levels are still pending as of 1445 03/05/2023.

## 2023-03-06 ENCOUNTER — Telehealth: Payer: Self-pay | Admitting: Oncology

## 2023-03-06 NOTE — Telephone Encounter (Signed)
Patient has been scheduled. Aware of appt date and time.   Scheduling Message Entered by Rennis Harding A on 03/01/2023 at  4:52 PM Priority: Routine <No visit type provided>  Department: [facility]  Provider:  Scheduling Notes:  Labs 08-26-23  Appt 09-02-23

## 2023-03-07 LAB — CALCITONIN: Calcitonin: <2 pg/mL (ref 0.0–5.0)

## 2023-03-13 ENCOUNTER — Telehealth: Payer: Self-pay

## 2023-03-13 DIAGNOSIS — J329 Chronic sinusitis, unspecified: Secondary | ICD-10-CM | POA: Diagnosis not present

## 2023-03-13 DIAGNOSIS — J40 Bronchitis, not specified as acute or chronic: Secondary | ICD-10-CM | POA: Diagnosis not present

## 2023-03-13 NOTE — Telephone Encounter (Signed)
Patient called for results of CEA, no results available in chart. Spoke with lab they have reached out to lab cor for the results. Message left for patient. No results available at this time. Will reach out to provider for further instructions. Does lab need to be repeated ?

## 2023-03-20 ENCOUNTER — Inpatient Hospital Stay: Payer: Medicare HMO | Attending: Oncology

## 2023-03-20 ENCOUNTER — Other Ambulatory Visit: Payer: Medicare HMO

## 2023-03-20 DIAGNOSIS — R970 Elevated carcinoembryonic antigen [CEA]: Secondary | ICD-10-CM | POA: Insufficient documentation

## 2023-03-20 DIAGNOSIS — C73 Malignant neoplasm of thyroid gland: Secondary | ICD-10-CM | POA: Insufficient documentation

## 2023-03-21 ENCOUNTER — Encounter: Payer: Self-pay | Admitting: Oncology

## 2023-03-21 LAB — CEA: CEA: 2.6 ng/mL (ref 0.0–4.7)

## 2023-04-16 ENCOUNTER — Encounter: Payer: Self-pay | Admitting: Oncology

## 2023-04-23 DIAGNOSIS — M19041 Primary osteoarthritis, right hand: Secondary | ICD-10-CM | POA: Diagnosis not present

## 2023-04-23 DIAGNOSIS — M79641 Pain in right hand: Secondary | ICD-10-CM | POA: Diagnosis not present

## 2023-04-23 DIAGNOSIS — M65321 Trigger finger, right index finger: Secondary | ICD-10-CM | POA: Diagnosis not present

## 2023-04-26 DIAGNOSIS — Z6831 Body mass index (BMI) 31.0-31.9, adult: Secondary | ICD-10-CM | POA: Diagnosis not present

## 2023-04-26 DIAGNOSIS — E890 Postprocedural hypothyroidism: Secondary | ICD-10-CM | POA: Diagnosis not present

## 2023-04-26 DIAGNOSIS — J3489 Other specified disorders of nose and nasal sinuses: Secondary | ICD-10-CM | POA: Diagnosis not present

## 2023-04-26 DIAGNOSIS — H02539 Eyelid retraction unspecified eye, unspecified lid: Secondary | ICD-10-CM | POA: Diagnosis not present

## 2023-06-18 DIAGNOSIS — S63641A Sprain of metacarpophalangeal joint of right thumb, initial encounter: Secondary | ICD-10-CM | POA: Diagnosis not present

## 2023-06-19 ENCOUNTER — Other Ambulatory Visit: Payer: Medicare HMO

## 2023-06-21 ENCOUNTER — Ambulatory Visit: Payer: Medicare HMO | Admitting: Oncology

## 2023-06-29 DIAGNOSIS — H524 Presbyopia: Secondary | ICD-10-CM | POA: Diagnosis not present

## 2023-06-29 DIAGNOSIS — H43811 Vitreous degeneration, right eye: Secondary | ICD-10-CM | POA: Diagnosis not present

## 2023-07-01 DIAGNOSIS — Z6831 Body mass index (BMI) 31.0-31.9, adult: Secondary | ICD-10-CM | POA: Diagnosis not present

## 2023-07-01 DIAGNOSIS — M5442 Lumbago with sciatica, left side: Secondary | ICD-10-CM | POA: Diagnosis not present

## 2023-07-17 DIAGNOSIS — R110 Nausea: Secondary | ICD-10-CM | POA: Diagnosis not present

## 2023-07-17 DIAGNOSIS — B0223 Postherpetic polyneuropathy: Secondary | ICD-10-CM | POA: Diagnosis not present

## 2023-07-27 ENCOUNTER — Encounter: Payer: Self-pay | Admitting: Oncology

## 2023-08-01 DIAGNOSIS — Z85850 Personal history of malignant neoplasm of thyroid: Secondary | ICD-10-CM | POA: Diagnosis not present

## 2023-08-01 DIAGNOSIS — C73 Malignant neoplasm of thyroid gland: Secondary | ICD-10-CM | POA: Diagnosis not present

## 2023-08-01 DIAGNOSIS — Z08 Encounter for follow-up examination after completed treatment for malignant neoplasm: Secondary | ICD-10-CM | POA: Diagnosis not present

## 2023-08-01 DIAGNOSIS — R590 Localized enlarged lymph nodes: Secondary | ICD-10-CM | POA: Diagnosis not present

## 2023-08-01 DIAGNOSIS — E890 Postprocedural hypothyroidism: Secondary | ICD-10-CM | POA: Diagnosis not present

## 2023-08-13 DIAGNOSIS — S63641D Sprain of metacarpophalangeal joint of right thumb, subsequent encounter: Secondary | ICD-10-CM | POA: Diagnosis not present

## 2023-08-19 ENCOUNTER — Encounter: Payer: Self-pay | Admitting: Oncology

## 2023-08-26 ENCOUNTER — Inpatient Hospital Stay: Payer: Medicare Other | Attending: Oncology

## 2023-08-26 DIAGNOSIS — B0223 Postherpetic polyneuropathy: Secondary | ICD-10-CM | POA: Diagnosis not present

## 2023-08-26 DIAGNOSIS — C73 Malignant neoplasm of thyroid gland: Secondary | ICD-10-CM | POA: Insufficient documentation

## 2023-08-26 DIAGNOSIS — Z0000 Encounter for general adult medical examination without abnormal findings: Secondary | ICD-10-CM | POA: Diagnosis not present

## 2023-08-26 DIAGNOSIS — J3489 Other specified disorders of nose and nasal sinuses: Secondary | ICD-10-CM | POA: Diagnosis not present

## 2023-08-26 LAB — TSH: TSH: 1.471 u[IU]/mL (ref 0.350–4.500)

## 2023-08-28 LAB — CALCITONIN: Calcitonin: <2 pg/mL (ref 0.0–5.0)

## 2023-09-01 NOTE — Progress Notes (Deleted)
 [facility]  [address] [place],  [place]  [address] [phone]  Clinic Day:  03/01/2023  Referring physician: Marylen Ponto, MD   HISTORY OF PRESENT ILLNESS:  The patient is a 78 y.o. female who I recently began seeing for newly diagnosed medullary thyroid carcinoma.  She comes in today to go over the pathology from her recent total thyroidectomy.   Since her surgery, she has been doing well.  She currently takes Synthroid for her thyroid replacement.  She denies having any neck discomfort, fullness, or other local findings which concern her for early disease recurrence.    PHYSICAL EXAM:  There were no vitals taken for this visit. Wt Readings from Last 3 Encounters:  03/01/23 193 lb 9.6 oz (87.8 kg)  12/25/22 193 lb 3.2 oz (87.6 kg)  12/18/22 192 lb 12.8 oz (87.5 kg)   There is no height or weight on file to calculate BMI. Performance status (ECOG): 1 - Symptomatic but completely ambulatory Physical Exam Constitutional:      Appearance: Normal appearance. She is not ill-appearing.  HENT:     Mouth/Throat:     Mouth: Mucous membranes are moist.     Pharynx: Oropharynx is clear. No oropharyngeal exudate or posterior oropharyngeal erythema.  Neck:     Comments: No palpable neck mass; her surgical scar appears well-healed  Cardiovascular:     Rate and Rhythm: Normal rate and regular rhythm.     Heart sounds: No murmur heard.    No friction rub. No gallop.  Pulmonary:     Effort: Pulmonary effort is normal. No respiratory distress.     Breath sounds: Normal breath sounds. No wheezing, rhonchi or rales.  Abdominal:     General: Bowel sounds are normal. There is no distension.     Palpations: Abdomen is soft. There is no mass.     Tenderness: There is no abdominal tenderness.  Musculoskeletal:        General: No swelling.     Right lower leg: No edema.     Left lower leg: No edema.  Lymphadenopathy:     Cervical: No cervical  adenopathy.     Upper Body:     Right upper body: No supraclavicular or axillary adenopathy.     Left upper body: No supraclavicular or axillary adenopathy.     Lower Body: No right inguinal adenopathy. No left inguinal adenopathy.  Skin:    General: Skin is warm.     Coloration: Skin is not jaundiced.     Findings: No lesion or rash.  Neurological:     General: No focal deficit present.     Mental Status: She is alert and oriented to person, place, and time. Mental status is at baseline.  Psychiatric:        Mood and Affect: Mood normal.        Behavior: Behavior normal.        Thought Content: Thought content normal.   PATHOLOGY: Final Diagnosis   A. THYROID, TOTAL THYROIDECTOMY:               Medullary thyroid carcinoma.  Tumor measures 1.2 cm in maximum gross dimension.              Margins uninvolved by carcinoma.               See comment.                B. LYMPH NODE, RIGHT  LEVEL 6 NODES, EXCISION:                Fibroadipose tissue.              No lymphoid tissue identified.    C. LYMPH NODE, LEFT LEVEL 6 NODES, EXCISION:                One lymph node negative for malignancy (0/1).     Electronically signed by Juanda Crumble, MD on 01/15/2023 at  1:20 PM  Comment   Immunoperoxidase studies show the neoplasm is positive for INSM1, synaptophysin, and calcitonin. KI 67 shows a low proliferation rate, staining 1% of the tumor nuclei.     Representative tumor block: A2, A3   Controls worked appropriately.  Dr. Wallene Dales has reviewed selected slides for confirmation of malignancy.    Synoptic Checklist THYROID GLAND THYROID GLAND: RESECTION - All Specimens 8th Edition - Protocol posted: 09/20/2021  SPECIMEN    Procedure:    Total thyroidectomy  TUMOR    Tumor Focality:    Unifocal    Tumor Characteristics:          Tumor Site:    Right lobe      Tumor Size:    Greatest Dimension (Centimeters): 1.2 cm      Histologic Tumor Types and Subtypes:            :     Medullary thyroid carcinoma      Tumor Proliferative Activity:            Mitotic Rate:    Less than 3 mitoses per 26mm2        :    0 mitoses per 2 mm2      Ki-67 Labeling Index:    1 %        Methodology:    Manual count    Tumor Necrosis:    Not identified    Angioinvasion (vascular invasion):    Not identified    Lymphatic Invasion:    Not identified    Extrathyroidal Extension:    Not identified    Margin Status:    All margins negative for carcinoma  REGIONAL LYMPH NODES  Regional Lymph Node Status:        :    All regional lymph nodes negative for tumor    Number of Lymph Nodes Examined:    1      Nodal Level(s) Examined:    Level VI  pTNM CLASSIFICATION (AJCC 8th Edition)    Reporting of pT, pN, and (when applicable) pM categories is based on information available to the pathologist at the time the report is issued. As per the AJCC (Chapter 1, 8th Ed.) it is the managing physician's responsibility to establish the final pathologic stage based upon all pertinent information, including but potentially not limited to this pathology report.    pT Category:    pT1b  pN Category:    pN0a    LABS:      Latest Ref Rng & Units 02/27/2023    4:03 PM 12/18/2022   12:00 AM 06/19/2022    1:55 PM  CBC  WBC 4.0 - 10.5 K/uL 7.2  7.0     7.1   Hemoglobin 12.0 - 15.0 g/dL 40.9  81.1     91.4   Hematocrit 36.0 - 46.0 % 36.9  37     38.5   Platelets 150 - 400 K/uL 183  175  188      This result is from an external source.      Latest Ref Rng & Units 02/27/2023    4:03 PM 12/18/2022    8:27 AM 06/19/2022    1:55 PM  CMP  Glucose 70 - 99 mg/dL 119  90  147   BUN 8 - 23 mg/dL 25  27  29    Creatinine 0.44 - 1.00 mg/dL 8.29  5.62  1.30   Sodium 135 - 145 mmol/L 138  140  140   Potassium 3.5 - 5.1 mmol/L 3.8  3.8  4.3   Chloride 98 - 111 mmol/L 102  106  108   CO2 22 - 32 mmol/L 25  27  24    Calcium 8.9 - 10.3 mg/dL 9.4  9.2  9.4   Total Protein 6.5 - 8.1 g/dL 6.9  7.4  7.3   Total  Bilirubin 0.3 - 1.2 mg/dL 1.1  0.8  0.8   Alkaline Phos 38 - 126 U/L 54  60  46   AST 15 - 41 U/L 19  15  19    ALT 0 - 44 U/L 15  13  16      Latest Reference Range & Units 12/18/22 08:27 02/27/23 16:03 08/26/23 13:41  Calcitonin 0.0 - 5.0 pg/mL 1,305.0 (H) <2.0 <2.0  (H): Data is abnormally high   Ref Range & Units  (08-01-23) 1 mo ago  Carcinoembryonic Antigen (CEA) <3.0 ng/mL 3.2 High    ASSESSMENT & PLAN:  A 78 y.o. female with stage I (T1b N0 M0) medullary thyroid cancer, status post a total thyroidectomy in July 2024.  I am very pleased with her labs today, which show an undetectable calcitonin level, which reflects efficacy of her total thyroidectomy removing all of her medullary thyroid cancer.  Her CEA level is pending.  Based upon her physical exam today, as well as her labs, it does appear that she has been a disease-free from her total thyroidectomy.  I will keep her elevated CEA level is significant available.  Otherwise, we will see the patient back in 6 months for repeat clinical assessment.  The patient understands all the plans discussed today and is in agreement with them.  ADDENDUM:  Latest Reference Range & Units 12/18/22 08:27  03/20/23 13:46  CEA 0.0 - 4.7 ng/mL 22.3 (H)  2.6  (H): Data is abnormally high  Her normal CEA level after her total thyroidectomy also lends credence to the belief that she has been a disease-free from this surgery.  Clinically, she is doing well.  She is already scheduled to see me back in early March 2025 for repeat clinical assessment.    Kirtan Sada Kirby Funk, MD

## 2023-09-02 ENCOUNTER — Inpatient Hospital Stay: Payer: Medicare Other | Admitting: Oncology

## 2023-09-04 NOTE — Progress Notes (Signed)
 [facility]  [address] [place],  [place]  [address] [phone]  Clinic Day:  09/05/2023  Referring physician: Marylen Ponto, MD  HISTORY OF PRESENT ILLNESS:  The patient is a 78 y.o. 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  Since her surgery, her CEA and calcitonin levels have completely normalized.  She comes in today for routine follow-up.  Since her last visit, the patient has been doing fairly well.  She  takes Synthroid for her thyroid replacement.  She denies having any neck discomfort, fullness, or other local findings which concern her for early disease recurrence.  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    PHYSICAL EXAM:  Blood pressure 122/68, pulse 61, temperature 97.9 F (36.6 C), temperature source Oral, resp. rate 18, height 5\' 6"  (1.676 m), weight 195 lb 3.2 oz (88.5 kg), SpO2 99%. Wt Readings from Last 3 Encounters:  09/05/23 195 lb 3.2 oz (88.5 kg)  03/01/23 193 lb 9.6 oz (87.8 kg)  12/25/22 193 lb 3.2 oz (87.6 kg)   Body mass index is 31.51 kg/m. Performance status (ECOG): 1 - Symptomatic but completely ambulatory Physical Exam Constitutional:      Appearance: Normal appearance. She is not ill-appearing.  HENT:     Mouth/Throat:     Mouth: Mucous membranes are moist.     Pharynx: Oropharynx is clear. No oropharyngeal exudate or posterior oropharyngeal erythema.  Neck:     Comments: No palpable neck masses  Cardiovascular:     Rate and Rhythm: Normal rate and regular rhythm.     Heart sounds: No murmur heard.    No friction rub. No gallop.  Pulmonary:     Effort: Pulmonary effort is normal. No respiratory distress.     Breath sounds: Normal breath sounds. No wheezing, rhonchi or rales.  Abdominal:     General: Bowel sounds are normal. There is no distension.     Palpations: Abdomen is soft. There is  no mass.     Tenderness: There is no abdominal tenderness.  Musculoskeletal:        General: No swelling.     Right lower leg: No edema.     Left lower leg: No edema.  Lymphadenopathy:     Cervical: No cervical adenopathy.     Upper Body:     Right upper body: No supraclavicular or axillary adenopathy.     Left upper body: No supraclavicular or axillary adenopathy.     Lower Body: No right inguinal adenopathy. No left inguinal adenopathy.  Skin:    General: Skin is warm.     Coloration: Skin is not jaundiced.     Findings: No lesion or rash.  Neurological:     General: No focal deficit present.     Mental Status: She is alert and oriented to person, place, and time. Mental status is at baseline.  Psychiatric:        Mood and Affect: Mood normal.        Behavior: Behavior normal.        Thought Content: Thought content normal.    PATHOLOGY: Final Diagnosis   A. THYROID, TOTAL THYROIDECTOMY:               Medullary thyroid carcinoma.  Tumor measures 1.2 cm in maximum gross dimension.  Margins uninvolved by carcinoma.               See comment.                B. LYMPH NODE, RIGHT LEVEL 6 NODES, EXCISION:                Fibroadipose tissue.              No lymphoid tissue identified.    C. LYMPH NODE, LEFT LEVEL 6 NODES, EXCISION:                One lymph node negative for malignancy (0/1).     Electronically signed by Juanda Crumble, MD on 01/15/2023 at  1:20 PM  Comment   Immunoperoxidase studies show the neoplasm is positive for INSM1, synaptophysin, and calcitonin. KI 67 shows a low proliferation rate, staining 1% of the tumor nuclei.     Representative tumor block: A2, A3   Controls worked appropriately.  Dr. Wallene Dales has reviewed selected slides for confirmation of malignancy.    Synoptic Checklist THYROID GLAND THYROID GLAND: RESECTION - All Specimens 8th Edition - Protocol posted: 09/20/2021  SPECIMEN    Procedure:    Total  thyroidectomy  TUMOR    Tumor Focality:    Unifocal    Tumor Characteristics:          Tumor Site:    Right lobe      Tumor Size:    Greatest Dimension (Centimeters): 1.2 cm      Histologic Tumor Types and Subtypes:            :    Medullary thyroid carcinoma      Tumor Proliferative Activity:            Mitotic Rate:    Less than 3 mitoses per 8mm2        :    0 mitoses per 2 mm2      Ki-67 Labeling Index:    1 %        Methodology:    Manual count    Tumor Necrosis:    Not identified    Angioinvasion (vascular invasion):    Not identified    Lymphatic Invasion:    Not identified    Extrathyroidal Extension:    Not identified    Margin Status:    All margins negative for carcinoma  REGIONAL LYMPH NODES  Regional Lymph Node Status:        :    All regional lymph nodes negative for tumor    Number of Lymph Nodes Examined:    1      Nodal Level(s) Examined:    Level VI  pTNM CLASSIFICATION (AJCC 8th Edition)    Reporting of pT, pN, and (when applicable) pM categories is based on information available to the pathologist at the time the report is issued. As per the AJCC (Chapter 1, 8th Ed.) it is the managing physician's responsibility to establish the final pathologic stage based upon all pertinent information, including but potentially not limited to this pathology report.    pT Category:    pT1b  pN Category:    pN0a    LABS:      Latest Ref Rng & Units 02/27/2023    4:03 PM 12/18/2022   12:00 AM 06/19/2022    1:55 PM  CBC  WBC 4.0 - 10.5 K/uL 7.2  7.0     7.1   Hemoglobin 12.0 -  15.0 g/dL 09.8  11.9     14.7   Hematocrit 36.0 - 46.0 % 36.9  37     38.5   Platelets 150 - 400 K/uL 183  175     188      This result is from an external source.      Latest Ref Rng & Units 02/27/2023    4:03 PM 12/18/2022    8:27 AM 06/19/2022    1:55 PM  CMP  Glucose 70 - 99 mg/dL 829  90  562   BUN 8 - 23 mg/dL 25  27  29    Creatinine 0.44 - 1.00 mg/dL 1.30  8.65  7.84   Sodium 135 -  145 mmol/L 138  140  140   Potassium 3.5 - 5.1 mmol/L 3.8  3.8  4.3   Chloride 98 - 111 mmol/L 102  106  108   CO2 22 - 32 mmol/L 25  27  24    Calcium 8.9 - 10.3 mg/dL 9.4  9.2  9.4   Total Protein 6.5 - 8.1 g/dL 6.9  7.4  7.3   Total Bilirubin 0.3 - 1.2 mg/dL 1.1  0.8  0.8   Alkaline Phos 38 - 126 U/L 54  60  46   AST 15 - 41 U/L 19  15  19    ALT 0 - 44 U/L 15  13  16      Latest Reference Range & Units 12/18/22 08:27 02/27/23 16:03 08/26/23 13:41  Calcitonin 0.0 - 5.0 pg/mL 1,305.0 (H) <2.0 <2.0  (H): Data is abnormally high   Ref Range & Units  (08-01-23) 1 mo ago  Carcinoembryonic Antigen (CEA) <3.0 ng/mL 3.2 High    ASSESSMENT & PLAN:  A 78 y.o. female with stage I (T1b N0 M0) medullary thyroid cancer, status post a total thyroidectomy in July 2024.  I remain very pleased with her labs today, which show an undetectable calcitonin level. Her most recent CEA level is only minimally elevated.  Both tumor markers will continue to be followed over time.  Otherwise, as she is dong well, I will  see this patient back in 6 months for repeat clinical assessment.  A neck ultrasound will also be done before her next visit for her continued radiographic thyroid carcinoma surveillance.  The patient understands all the plans discussed today and is in agreement with them.  Kaisen Ackers Kirby Funk, MD

## 2023-09-05 ENCOUNTER — Other Ambulatory Visit: Payer: Self-pay | Admitting: Oncology

## 2023-09-05 ENCOUNTER — Inpatient Hospital Stay: Attending: Oncology | Admitting: Oncology

## 2023-09-05 VITALS — BP 122/68 | HR 61 | Temp 97.9°F | Resp 18 | Ht 66.0 in | Wt 195.2 lb

## 2023-09-05 DIAGNOSIS — Z85850 Personal history of malignant neoplasm of thyroid: Secondary | ICD-10-CM | POA: Insufficient documentation

## 2023-09-05 DIAGNOSIS — C73 Malignant neoplasm of thyroid gland: Secondary | ICD-10-CM

## 2023-09-05 DIAGNOSIS — E890 Postprocedural hypothyroidism: Secondary | ICD-10-CM | POA: Insufficient documentation

## 2023-09-06 ENCOUNTER — Encounter: Payer: Self-pay | Admitting: Oncology

## 2023-10-09 DIAGNOSIS — J329 Chronic sinusitis, unspecified: Secondary | ICD-10-CM | POA: Diagnosis not present

## 2023-10-16 DIAGNOSIS — L0212 Furuncle of neck: Secondary | ICD-10-CM | POA: Diagnosis not present

## 2023-11-12 DIAGNOSIS — J329 Chronic sinusitis, unspecified: Secondary | ICD-10-CM | POA: Diagnosis not present

## 2023-12-05 DIAGNOSIS — J329 Chronic sinusitis, unspecified: Secondary | ICD-10-CM | POA: Diagnosis not present

## 2023-12-11 DIAGNOSIS — I495 Sick sinus syndrome: Secondary | ICD-10-CM | POA: Diagnosis not present

## 2023-12-11 DIAGNOSIS — R29818 Other symptoms and signs involving the nervous system: Secondary | ICD-10-CM | POA: Diagnosis not present

## 2023-12-11 DIAGNOSIS — Z9889 Other specified postprocedural states: Secondary | ICD-10-CM | POA: Diagnosis not present

## 2023-12-11 DIAGNOSIS — Z8679 Personal history of other diseases of the circulatory system: Secondary | ICD-10-CM | POA: Diagnosis not present

## 2023-12-11 DIAGNOSIS — R001 Bradycardia, unspecified: Secondary | ICD-10-CM | POA: Diagnosis not present

## 2023-12-24 DIAGNOSIS — H02834 Dermatochalasis of left upper eyelid: Secondary | ICD-10-CM | POA: Diagnosis not present

## 2023-12-24 DIAGNOSIS — H02831 Dermatochalasis of right upper eyelid: Secondary | ICD-10-CM | POA: Diagnosis not present

## 2024-01-06 DIAGNOSIS — H109 Unspecified conjunctivitis: Secondary | ICD-10-CM | POA: Diagnosis not present

## 2024-01-06 DIAGNOSIS — M2042 Other hammer toe(s) (acquired), left foot: Secondary | ICD-10-CM | POA: Diagnosis not present

## 2024-01-06 DIAGNOSIS — C73 Malignant neoplasm of thyroid gland: Secondary | ICD-10-CM | POA: Diagnosis not present

## 2024-01-06 DIAGNOSIS — M2041 Other hammer toe(s) (acquired), right foot: Secondary | ICD-10-CM | POA: Diagnosis not present

## 2024-01-06 DIAGNOSIS — J309 Allergic rhinitis, unspecified: Secondary | ICD-10-CM | POA: Diagnosis not present

## 2024-01-07 DIAGNOSIS — G4730 Sleep apnea, unspecified: Secondary | ICD-10-CM | POA: Diagnosis not present

## 2024-01-23 DIAGNOSIS — R509 Fever, unspecified: Secondary | ICD-10-CM | POA: Diagnosis not present

## 2024-01-23 DIAGNOSIS — R070 Pain in throat: Secondary | ICD-10-CM | POA: Diagnosis not present

## 2024-01-23 DIAGNOSIS — L0100 Impetigo, unspecified: Secondary | ICD-10-CM | POA: Diagnosis not present

## 2024-02-09 DIAGNOSIS — G4761 Periodic limb movement disorder: Secondary | ICD-10-CM | POA: Diagnosis not present

## 2024-02-10 DIAGNOSIS — G4761 Periodic limb movement disorder: Secondary | ICD-10-CM | POA: Diagnosis not present

## 2024-02-20 DIAGNOSIS — B070 Plantar wart: Secondary | ICD-10-CM | POA: Diagnosis not present

## 2024-02-20 DIAGNOSIS — R600 Localized edema: Secondary | ICD-10-CM | POA: Diagnosis not present

## 2024-02-20 DIAGNOSIS — L03119 Cellulitis of unspecified part of limb: Secondary | ICD-10-CM | POA: Diagnosis not present

## 2024-02-20 DIAGNOSIS — M940 Chondrocostal junction syndrome [Tietze]: Secondary | ICD-10-CM | POA: Diagnosis not present

## 2024-02-21 ENCOUNTER — Encounter: Payer: Self-pay | Admitting: Oncology

## 2024-03-05 ENCOUNTER — Ambulatory Visit (INDEPENDENT_AMBULATORY_CARE_PROVIDER_SITE_OTHER)
Admission: RE | Admit: 2024-03-05 | Discharge: 2024-03-05 | Disposition: A | Discharge status: Home / Self Care | Source: Ambulatory Visit | Attending: Oncology | Admitting: Oncology

## 2024-03-05 ENCOUNTER — Inpatient Hospital Stay: Attending: Oncology

## 2024-03-05 DIAGNOSIS — C73 Malignant neoplasm of thyroid gland: Secondary | ICD-10-CM

## 2024-03-05 DIAGNOSIS — R970 Elevated carcinoembryonic antigen [CEA]: Secondary | ICD-10-CM | POA: Diagnosis not present

## 2024-03-05 DIAGNOSIS — Z85850 Personal history of malignant neoplasm of thyroid: Secondary | ICD-10-CM | POA: Diagnosis not present

## 2024-03-05 LAB — T4, FREE: Free T4: 1.49 ng/dL — ABNORMAL HIGH (ref 0.61–1.12)

## 2024-03-05 LAB — CEA (ACCESS): CEA (CHCC): 1.45 ng/mL (ref 0.00–5.00)

## 2024-03-05 LAB — TSH: TSH: 0.127 u[IU]/mL — ABNORMAL LOW (ref 0.350–4.500)

## 2024-03-06 ENCOUNTER — Ambulatory Visit

## 2024-03-06 ENCOUNTER — Ambulatory Visit: Admitting: Podiatry

## 2024-03-06 DIAGNOSIS — M778 Other enthesopathies, not elsewhere classified: Secondary | ICD-10-CM

## 2024-03-06 DIAGNOSIS — D2372 Other benign neoplasm of skin of left lower limb, including hip: Secondary | ICD-10-CM

## 2024-03-06 LAB — CALCITONIN: Calcitonin: <2 pg/mL (ref 0.0–5.0)

## 2024-03-06 NOTE — Progress Notes (Signed)
      Chief Complaint  Patient presents with   Plantar Warts    Left foot possible PW Submet 1. Dr. Ina referred her. Been present 3 months. Tried the otc corn removers.  Not diabetic  ASA   HPI: 78 y.o. female presents today with concern of a painful skin lesion near the left submet 1 area.  States has been there for about 2 months.  Denies stepping on a foreign object or any drainage or bleeding.  She has been trying some over-the-counter acid treatments without success.  Past Medical History:  Diagnosis Date   Allergy    Depression    Low blood pressure    Pigment cirrhosis (HCC)    Post-operative nausea and vomiting    Past Surgical History:  Procedure Laterality Date   ATRIAL FIBRILLATION ABLATION  2016   CHOLECYSTECTOMY  2017   COLONOSCOPY  2013   w/Dr.Gupta-normal exam per pt   PARTIAL HYSTERECTOMY     SHOULDER SURGERY     TONSILLECTOMY AND ADENOIDECTOMY  at 8   TUBAL LIGATION  1974   UPPER GASTROINTESTINAL ENDOSCOPY  08/14/2017   Gastric ulcers (biopsied). Mild gastritis. Status post empiric esophageal dilatation   Allergies  Allergen Reactions   Codeine Other (See Comments)    unknown    Latex Rash   Penicillins Rash    Physical Exam: Palpable pedal pulses left foot.  There is a hyperkeratotic lesion with a focal center, with pain on palpation just anterior to the left submet 1.  No surrounding erythema is noted.  Epicritic sensation is intact.  Radiographic Exam, left foot, 3 weightbearing views with skin marker utilized:  Normal osseous mineralization. No fractures noted.  No evidence of foreign body.  Assessment/Plan of Care: 1. Benign neoplasm of skin of left foot   2. Capsulitis of left foot     Reviewed findings with the patient today.  With the patient's verbal consent the lesion was shaved with sterile #[id number] blade.  2 applications of Cantharone solution were applied followed by a Band-Aid for occlusion.  She will remove this in 6 hours.  May expect  blistering tomorrow.  Felt offloading pads were dispensed.  Follow-up in 3 to 4 weeks for recheck   Jane Garcia CHARM Imperial, DPM, FACFAS [facility]     [address] [place], [place] [address]                Office [phone]  Fax [phone]

## 2024-03-11 NOTE — Progress Notes (Unsigned)
 [name]  [address] [place],  [place]  [address] [phone]  Clinic Day:  09/05/2023  Referring physician: Ina Marcellus RAMAN, MD  HISTORY OF PRESENT ILLNESS:  The patient is a 78 y.o. female with stage I (T1b N0 M0) medullary thyroid  carcinoma, status post a total thyroidectomy in July 2024.  Since her surgery, her CEA and calcitonin levels have completely normalized.  She comes in today for routine follow-up.  Since her last visit, the patient has been doing fairly well.  She  takes Synthroid for her thyroid  replacement.  She denies having any neck discomfort, fullness, or other local findings which concern her for early disease recurrence.  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    PHYSICAL EXAM:  There were no vitals taken for this visit. Wt Readings from Last 3 Encounters:  09/05/23 195 lb 3.2 oz (88.5 kg)  03/01/23 193 lb 9.6 oz (87.8 kg)  12/25/22 193 lb 3.2 oz (87.6 kg)   There is no height or weight on file to calculate BMI. Performance status (ECOG): 1 - Symptomatic but completely ambulatory Physical Exam Constitutional:      Appearance: Normal appearance. She is not ill-appearing.  HENT:     Mouth/Throat:     Mouth: Mucous membranes are moist.     Pharynx: Oropharynx is clear. No oropharyngeal exudate or posterior oropharyngeal erythema.  Neck:     Comments: No palpable neck masses  Cardiovascular:     Rate and Rhythm: Normal rate and regular rhythm.     Heart sounds: No murmur heard.    No friction rub. No gallop.  Pulmonary:     Effort: Pulmonary effort is normal. No respiratory distress.     Breath sounds: Normal breath sounds. No wheezing, rhonchi or rales.  Abdominal:     General: Bowel sounds are normal. There is no distension.     Palpations: Abdomen is soft. There is no mass.     Tenderness: There is no abdominal tenderness.  Musculoskeletal:        General: No  swelling.     Right lower leg: No edema.     Left lower leg: No edema.  Lymphadenopathy:     Cervical: No cervical adenopathy.     Upper Body:     Right upper body: No supraclavicular or axillary adenopathy.     Left upper body: No supraclavicular or axillary adenopathy.     Lower Body: No right inguinal adenopathy. No left inguinal adenopathy.  Skin:    General: Skin is warm.     Coloration: Skin is not jaundiced.     Findings: No lesion or rash.  Neurological:     General: No focal deficit present.     Mental Status: She is alert and oriented to person, place, and time. Mental status is at baseline.  Psychiatric:        Mood and Affect: Mood normal.        Behavior: Behavior normal.        Thought Content: Thought content normal.   SCANS:  Her neck ultrasound revealed the following: FINDINGS: Sonographic evaluation of the surgical thyroid  bed demonstrates no residual thyroid  tissue or abnormal soft tissue. No abnormal appearing lymph nodes identified.   IMPRESSION: No evidence of residual thyroid  tissue or abnormal soft tissue in the surgical thyroid  bed. No abnormal appearing lymph nodes identified.  PATHOLOGY: Final Diagnosis   A. THYROID ,  TOTAL THYROIDECTOMY:               Medullary thyroid  carcinoma.  Tumor measures 1.2 cm in maximum gross dimension.              Margins uninvolved by carcinoma.               See comment.                B. LYMPH NODE, RIGHT LEVEL 6 NODES, EXCISION:                Fibroadipose tissue.              No lymphoid tissue identified.    C. LYMPH NODE, LEFT LEVEL 6 NODES, EXCISION:                One lymph node negative for malignancy (0/1).     Electronically signed by Jon Ronnald Nephew, MD on 01/15/2023 at  1:20 PM  Comment   Immunoperoxidase studies show the neoplasm is positive for INSM1, synaptophysin, and calcitonin. KI 67 shows a low proliferation rate, staining 1% of the tumor nuclei.     Representative tumor block: A2, A3    Controls worked appropriately.  Dr. Seena has reviewed selected slides for confirmation of malignancy.    Synoptic Checklist THYROID  GLAND THYROID  GLAND: RESECTION - All Specimens 8th Edition - Protocol posted: 09/20/2021  SPECIMEN    Procedure:    Total thyroidectomy  TUMOR    Tumor Focality:    Unifocal    Tumor Characteristics:          Tumor Site:    Right lobe      Tumor Size:    Greatest Dimension (Centimeters): 1.2 cm      Histologic Tumor Types and Subtypes:            :    Medullary thyroid  carcinoma      Tumor Proliferative Activity:            Mitotic Rate:    Less than 3 mitoses per 75mm2        :    0 mitoses per 2 mm2      Ki-67 Labeling Index:    1 %        Methodology:    Manual count    Tumor Necrosis:    Not identified    Angioinvasion (vascular invasion):    Not identified    Lymphatic Invasion:    Not identified    Extrathyroidal Extension:    Not identified    Margin Status:    All margins negative for carcinoma  REGIONAL LYMPH NODES  Regional Lymph Node Status:        :    All regional lymph nodes negative for tumor    Number of Lymph Nodes Examined:    1      Nodal Level(s) Examined:    Level VI  pTNM CLASSIFICATION (AJCC 8th Edition)    Reporting of pT, pN, and (when applicable) pM categories is based on information available to the pathologist at the time the report is issued. As per the AJCC (Chapter 1, 8th Ed.) it is the managing physician's responsibility to establish the final pathologic stage based upon all pertinent information, including but potentially not limited to this pathology report.    pT Category:    pT1b  pN Category:    pN0a    LABS:      Latest Ref  Rng & Units 02/27/2023    4:03 PM 12/18/2022   12:00 AM 06/19/2022    1:55 PM  CBC  WBC 4.0 - 10.5 K/uL 7.2  7.0     7.1   Hemoglobin 12.0 - 15.0 g/dL 87.9  87.5     87.9   Hematocrit 36.0 - 46.0 % 36.9  37     38.5   Platelets 150 - 400 K/uL 183  175     188      This result is  from an external source.      Latest Ref Rng & Units 02/27/2023    4:03 PM 12/18/2022    8:27 AM 06/19/2022    1:55 PM  CMP  Glucose 70 - 99 mg/dL 896  90  897   BUN 8 - 23 mg/dL 25  27  29    Creatinine 0.44 - 1.00 mg/dL 8.91  8.95  9.05   Sodium 135 - 145 mmol/L 138  140  140   Potassium 3.5 - 5.1 mmol/L 3.8  3.8  4.3   Chloride 98 - 111 mmol/L 102  106  108   CO2 22 - 32 mmol/L 25  27  24    Calcium 8.9 - 10.3 mg/dL 9.4  9.2  9.4   Total Protein 6.5 - 8.1 g/dL 6.9  7.4  7.3   Total Bilirubin 0.3 - 1.2 mg/dL 1.1  0.8  0.8   Alkaline Phos 38 - 126 U/L 54  60  46   AST 15 - 41 U/L 19  15  19    ALT 0 - 44 U/L 15  13  16      Latest Reference Range & Units 03/05/24 14:18  Calcitonin 0.0 - 5.0 pg/mL <2.0  CEA ([facility]) 0.00 - 5.00 ng/mL 1.45    ASSESSMENT & PLAN:  A 78 y.o. female with stage I (T1b N0 M0) medullary thyroid  cancer, status post a total thyroidectomy in July 2024.  I remain very pleased with her labs today, which show an undetectable calcitonin level. Her most recent CEA level is only minimally elevated.  Both tumor markers will continue to be followed over time.  Otherwise, as she is dong well, I will  see this patient back in 6 months for repeat clinical assessment.  A neck ultrasound will also be done before her next visit for her continued radiographic thyroid  carcinoma surveillance.  The patient understands all the plans discussed today and is in agreement with them.  Jeimy Bickert DELENA Kerns, MD

## 2024-03-12 ENCOUNTER — Inpatient Hospital Stay (HOSPITAL_BASED_OUTPATIENT_CLINIC_OR_DEPARTMENT_OTHER): Admitting: Oncology

## 2024-03-12 ENCOUNTER — Telehealth: Payer: Self-pay | Admitting: Oncology

## 2024-03-12 ENCOUNTER — Other Ambulatory Visit: Payer: Self-pay | Admitting: Oncology

## 2024-03-12 VITALS — BP 126/61 | HR 70 | Temp 98.5°F | Resp 16 | Ht 66.0 in | Wt 200.3 lb

## 2024-03-12 DIAGNOSIS — Z85850 Personal history of malignant neoplasm of thyroid: Secondary | ICD-10-CM | POA: Diagnosis not present

## 2024-03-12 DIAGNOSIS — C73 Malignant neoplasm of thyroid gland: Secondary | ICD-10-CM

## 2024-03-12 DIAGNOSIS — R970 Elevated carcinoembryonic antigen [CEA]: Secondary | ICD-10-CM | POA: Diagnosis not present

## 2024-03-12 NOTE — Telephone Encounter (Signed)
 Patient has been scheduled for follow-up visit per 03/12/24 LOS.  Pt noted appt details on personal electronic device.

## 2024-03-12 NOTE — Telephone Encounter (Signed)
 Patient has been scheduled for follow-up visit per 03/12/24 LOS.  Pt given an appt calendar with date and time.

## 2024-03-14 ENCOUNTER — Encounter: Payer: Self-pay | Admitting: Oncology

## 2024-03-30 DIAGNOSIS — H02831 Dermatochalasis of right upper eyelid: Secondary | ICD-10-CM | POA: Diagnosis not present

## 2024-03-30 DIAGNOSIS — Z01818 Encounter for other preprocedural examination: Secondary | ICD-10-CM | POA: Diagnosis not present

## 2024-03-30 DIAGNOSIS — H53453 Other localized visual field defect, bilateral: Secondary | ICD-10-CM | POA: Diagnosis not present

## 2024-03-30 DIAGNOSIS — H02834 Dermatochalasis of left upper eyelid: Secondary | ICD-10-CM | POA: Diagnosis not present

## 2024-04-03 ENCOUNTER — Ambulatory Visit: Admitting: Podiatry

## 2024-04-03 DIAGNOSIS — D2372 Other benign neoplasm of skin of left lower limb, including hip: Secondary | ICD-10-CM

## 2024-04-03 NOTE — Progress Notes (Signed)
  Chief Complaint  Patient presents with   Plantar Warts    Left foot sub-met 1 area, spot is pretty smooth and is not causing her pain currently.  I do have her soaking, just in case treatment is needed.  Just as an FYI, she had eye SX on Monday, so she is black and blue.  Not  diabetic ASA.    HPI: 78 y.o. female presents today for follow-up of benign skin lesion left submet 1.  This was treated with Cantharone on her last appointment.  She had an upper blepharoplasty bilateral, and presents with bruising around her eyes today.  Past Medical History:  Diagnosis Date   Allergy    Depression    Low blood pressure    Pigment cirrhosis    Post-operative nausea and vomiting    Past Surgical History:  Procedure Laterality Date   ATRIAL FIBRILLATION ABLATION  2016   CHOLECYSTECTOMY  2017   COLONOSCOPY  2013   w/Dr.Gupta-normal exam per pt   PARTIAL HYSTERECTOMY     SHOULDER SURGERY     TONSILLECTOMY AND ADENOIDECTOMY  at 8   TUBAL LIGATION  1974   UPPER GASTROINTESTINAL ENDOSCOPY  08/14/2017   Gastric ulcers (biopsied). Mild gastritis. Status post empiric esophageal dilatation   Allergies  Allergen Reactions   Codeine Other (See Comments)    unknown    Latex Rash   Penicillins Rash    Physical Exam: Palpable pedal pulses.  The skin lesion left submet 1 appears resolved at this time.  There is 1 small pinpoint area of dried blood present.  This was shaved away uneventfully.  No pain on palpation is noted to the area.  No surrounding erythema is noted.  Assessment/Plan of Care: 1. Benign neoplasm of skin of left foot    Informed patient that the lesion appears resolved.  Instructed her to keep an eye on the area for any signs of recurrence.  Follow-up as needed  Awanda CHARM Imperial, DPM, FACFAS [facility]     [address] [place], [place] [address]                Office [phone]  Fax [phone]

## 2024-09-02 ENCOUNTER — Other Ambulatory Visit

## 2024-09-08 ENCOUNTER — Ambulatory Visit: Admitting: Oncology
